# Patient Record
Sex: Male | Born: 1940 | Race: Black or African American | Hispanic: No | Marital: Married | Smoking: Former smoker
Health system: Southern US, Community
[De-identification: ages and names within clinical notes are randomized; demographics above are authoritative.]

## PROBLEM LIST (undated history)

## (undated) DIAGNOSIS — R972 Elevated prostate specific antigen [PSA]: Secondary | ICD-10-CM

## (undated) DIAGNOSIS — E7800 Pure hypercholesterolemia, unspecified: Secondary | ICD-10-CM

## (undated) DIAGNOSIS — I739 Peripheral vascular disease, unspecified: Secondary | ICD-10-CM

## (undated) DIAGNOSIS — D649 Anemia, unspecified: Secondary | ICD-10-CM

## (undated) DIAGNOSIS — K219 Gastro-esophageal reflux disease without esophagitis: Secondary | ICD-10-CM

## (undated) DIAGNOSIS — Z9889 Other specified postprocedural states: Secondary | ICD-10-CM

## (undated) DIAGNOSIS — R339 Retention of urine, unspecified: Secondary | ICD-10-CM

## (undated) DIAGNOSIS — N529 Male erectile dysfunction, unspecified: Secondary | ICD-10-CM

## (undated) DIAGNOSIS — J449 Chronic obstructive pulmonary disease, unspecified: Secondary | ICD-10-CM

## (undated) DIAGNOSIS — R351 Nocturia: Secondary | ICD-10-CM

## (undated) DIAGNOSIS — N400 Enlarged prostate without lower urinary tract symptoms: Secondary | ICD-10-CM

## (undated) DIAGNOSIS — N62 Hypertrophy of breast: Secondary | ICD-10-CM

## (undated) DIAGNOSIS — C801 Malignant (primary) neoplasm, unspecified: Secondary | ICD-10-CM

## (undated) DIAGNOSIS — N138 Other obstructive and reflux uropathy: Secondary | ICD-10-CM

## (undated) DIAGNOSIS — N320 Bladder-neck obstruction: Secondary | ICD-10-CM

## (undated) DIAGNOSIS — I10 Essential (primary) hypertension: Secondary | ICD-10-CM

## (undated) DIAGNOSIS — N403 Nodular prostate with lower urinary tract symptoms: Secondary | ICD-10-CM

## (undated) HISTORY — DX: Other obstructive and reflux uropathy: N13.8

## (undated) HISTORY — DX: Benign prostatic hyperplasia without lower urinary tract symptoms: N40.0

## (undated) HISTORY — DX: Chronic obstructive pulmonary disease, unspecified: J44.9

## (undated) HISTORY — DX: Hypertrophy of breast: N62

## (undated) HISTORY — DX: Male erectile dysfunction, unspecified: N52.9

## (undated) HISTORY — DX: Nocturia: R35.1

## (undated) HISTORY — DX: Anemia, unspecified: D64.9

## (undated) HISTORY — DX: Gastro-esophageal reflux disease without esophagitis: K21.9

## (undated) HISTORY — DX: Retention of urine, unspecified: R33.9

## (undated) HISTORY — DX: Other obstructive and reflux uropathy: N40.3

## (undated) HISTORY — DX: Other specified postprocedural states: Z98.890

## (undated) HISTORY — DX: Bladder-neck obstruction: N32.0

## (undated) HISTORY — DX: Peripheral vascular disease, unspecified: I73.9

## (undated) HISTORY — PX: ANGIOPLASTY: SHX39

## (undated) HISTORY — DX: Essential (primary) hypertension: I10

## (undated) HISTORY — DX: Elevated prostate specific antigen (PSA): R97.20

## (undated) HISTORY — DX: Pure hypercholesterolemia, unspecified: E78.00

---

## 1998-09-30 DIAGNOSIS — Z9889 Other specified postprocedural states: Secondary | ICD-10-CM

## 1998-09-30 HISTORY — DX: Other specified postprocedural states: Z98.890

## 2003-08-12 ENCOUNTER — Other Ambulatory Visit: Payer: Self-pay

## 2004-04-01 ENCOUNTER — Ambulatory Visit: Payer: Self-pay | Admitting: Internal Medicine

## 2004-04-20 ENCOUNTER — Ambulatory Visit: Payer: Self-pay | Admitting: *Deleted

## 2004-05-18 ENCOUNTER — Ambulatory Visit: Payer: Self-pay | Admitting: *Deleted

## 2004-05-22 ENCOUNTER — Inpatient Hospital Stay: Payer: Self-pay | Admitting: Internal Medicine

## 2004-05-30 ENCOUNTER — Observation Stay: Payer: Self-pay | Admitting: Internal Medicine

## 2004-06-01 ENCOUNTER — Ambulatory Visit: Payer: Self-pay | Admitting: Unknown Physician Specialty

## 2004-06-02 ENCOUNTER — Ambulatory Visit: Payer: Self-pay | Admitting: Internal Medicine

## 2004-06-17 ENCOUNTER — Other Ambulatory Visit: Payer: Self-pay

## 2004-06-17 ENCOUNTER — Inpatient Hospital Stay: Payer: Self-pay | Admitting: Cardiology

## 2004-06-18 ENCOUNTER — Other Ambulatory Visit: Payer: Self-pay

## 2004-08-14 ENCOUNTER — Ambulatory Visit: Payer: Self-pay | Admitting: Internal Medicine

## 2004-09-08 ENCOUNTER — Ambulatory Visit: Payer: Self-pay | Admitting: Internal Medicine

## 2004-10-09 ENCOUNTER — Ambulatory Visit: Payer: Self-pay | Admitting: Internal Medicine

## 2004-11-01 ENCOUNTER — Ambulatory Visit: Payer: Self-pay | Admitting: Internal Medicine

## 2004-11-18 ENCOUNTER — Ambulatory Visit: Payer: Self-pay | Admitting: Unknown Physician Specialty

## 2005-06-08 ENCOUNTER — Ambulatory Visit: Payer: Self-pay | Admitting: Internal Medicine

## 2005-11-08 HISTORY — PX: OTHER SURGICAL HISTORY: SHX169

## 2006-11-16 ENCOUNTER — Ambulatory Visit: Payer: Self-pay | Admitting: Unknown Physician Specialty

## 2007-05-30 ENCOUNTER — Ambulatory Visit: Payer: Self-pay | Admitting: Unknown Physician Specialty

## 2009-03-08 ENCOUNTER — Emergency Department: Payer: Self-pay | Admitting: Unknown Physician Specialty

## 2009-03-09 ENCOUNTER — Emergency Department: Payer: Self-pay | Admitting: Internal Medicine

## 2010-03-17 ENCOUNTER — Ambulatory Visit: Payer: Self-pay | Admitting: Cardiology

## 2010-04-29 ENCOUNTER — Ambulatory Visit: Payer: Self-pay | Admitting: Specialist

## 2010-06-08 ENCOUNTER — Ambulatory Visit: Payer: Self-pay | Admitting: Unknown Physician Specialty

## 2010-06-08 HISTORY — PX: COLONOSCOPY: SHX174

## 2010-06-08 HISTORY — PX: UPPER GI ENDOSCOPY: SHX6162

## 2010-06-09 LAB — PATHOLOGY REPORT

## 2011-11-16 ENCOUNTER — Ambulatory Visit (INDEPENDENT_AMBULATORY_CARE_PROVIDER_SITE_OTHER): Payer: Medicare Other | Admitting: Internal Medicine

## 2011-11-16 ENCOUNTER — Encounter: Payer: Self-pay | Admitting: Internal Medicine

## 2011-11-16 VITALS — BP 132/82 | HR 90 | Temp 97.8°F | Ht 71.0 in | Wt 173.0 lb

## 2011-11-16 DIAGNOSIS — Z139 Encounter for screening, unspecified: Secondary | ICD-10-CM

## 2011-11-16 DIAGNOSIS — I10 Essential (primary) hypertension: Secondary | ICD-10-CM

## 2011-11-16 DIAGNOSIS — E7800 Pure hypercholesterolemia, unspecified: Secondary | ICD-10-CM

## 2011-11-16 DIAGNOSIS — D649 Anemia, unspecified: Secondary | ICD-10-CM

## 2011-11-16 DIAGNOSIS — I739 Peripheral vascular disease, unspecified: Secondary | ICD-10-CM

## 2011-11-16 MED ORDER — ESOMEPRAZOLE MAGNESIUM 40 MG PO CPDR: 40.0000 mg | DELAYED_RELEASE_CAPSULE | Freq: Two times a day (BID) | ORAL | Status: DC

## 2011-11-16 NOTE — Patient Instructions (Signed)
It was good seeing you today.  I am glad you have been doing well.  I am going to schedule labs to be drawn this week.  We will notify you of the results once they become available.

## 2011-11-17 ENCOUNTER — Encounter: Payer: Self-pay | Admitting: Internal Medicine

## 2011-11-17 DIAGNOSIS — I739 Peripheral vascular disease, unspecified: Secondary | ICD-10-CM | POA: Insufficient documentation

## 2011-11-17 DIAGNOSIS — E7800 Pure hypercholesterolemia, unspecified: Secondary | ICD-10-CM | POA: Insufficient documentation

## 2011-11-17 DIAGNOSIS — I10 Essential (primary) hypertension: Secondary | ICD-10-CM | POA: Insufficient documentation

## 2011-11-17 NOTE — Assessment & Plan Note (Signed)
Is intolerant to statin medication.  He is currently taking WelChol. We'll get him scheduled for a fasting lipid panel. Continue low cholesterol diet and exercise.

## 2011-11-17 NOTE — Assessment & Plan Note (Signed)
He is currently without any leg pain with ambulation and exercise (s/p intervention). Exercises regularly. Continue risk factor modification. Followup.

## 2011-11-17 NOTE — Progress Notes (Signed)
  Subjective:    Patient ID: Adrian Cohen, male    DOB: [birth year], 71 y.o.   MRN: [id number]  HPI 71 year old male with past history of hypertension and hypercholesterolemia who comes in today for a scheduled follow up.  He states he's been doing well. He is staying active. Exercises regularly. States he goes to the [employer] at least 4-5 days a week. Walks at least 2-3 miles each time he exercises. He saw Dr. Meredeth Ide 2-3 weeks ago. Had his flu shot last week. Sees Dr. Achilles Dunk for his prostate screening and was evaluated recently - PSA was within normal limits. He is on a yearly schedule. He is scheduled to see Dr. Darrold Junker next month. Denies any chest pain tightness or shortness of breath with increased activity or exertion. States he feels that he is doing well. Nexium is controlling his acid reflux symptoms.  Past Medical History  Diagnosis Date  . Hypertension   . Hypercholesterolemia   . Peripheral vascular disease     Review of Systems Patient denies any headache, lightheadedness or dizziness.  No significant allergies or sinus symptoms. No chest pain, tightness or palpatations.  No increased shortness of breath, cough or congestion. No acid reflux, dysphagia or odynophagia. No nausea or vomiting.  No abdominal pain or cramping.  No bowel change, such as diarrhea, constipation, BRBPR or melana.  No urine change.        Objective:   Physical Exam Filed Vitals:   11/16/11 1438  BP: 132/82  Pulse: 90  Temp: 97.8 F (36.6 C)   71year old male in no acute distress.   HEENT:  Nares - clear.  OP- without lesions or erythema.  NECK:  Supple, nontender.  No audible carotid bruit.   HEART:  Appears to be regular. LUNGS:  Without crackles or wheezing audible.  Respirations even and unlabored.   RADIAL PULSE:  Equal bilaterally.  ABDOMEN:  Soft, nontender.  No audible abdominal bruit.   EXTREMITIES:  No increased edema to be present.                    Assessment & Plan:  Pulmonary. His  breathing is stable. He has no shortness of breath with increased activity or exertion.  Just saw Dr. Meredeth Ide 2-3 weeks ago.    Health maintenance. Schedule him for a physical at his next visit.  He is up to date with his colonoscopy. Sees gastroenterology.  He is up to date with prostate screening.  See above. Will schedule for fasting labs to include a cholesterol check.  Had his flu shot last week.

## 2011-11-17 NOTE — Assessment & Plan Note (Signed)
Blood pressure is under good control. Continue current medication regimen. Check metabolic panel with next labs.

## 2011-11-19 ENCOUNTER — Other Ambulatory Visit (INDEPENDENT_AMBULATORY_CARE_PROVIDER_SITE_OTHER): Payer: Medicare Other

## 2011-11-19 DIAGNOSIS — I10 Essential (primary) hypertension: Secondary | ICD-10-CM

## 2011-11-19 DIAGNOSIS — Z139 Encounter for screening, unspecified: Secondary | ICD-10-CM

## 2011-11-19 DIAGNOSIS — E7800 Pure hypercholesterolemia, unspecified: Secondary | ICD-10-CM

## 2011-11-19 DIAGNOSIS — D649 Anemia, unspecified: Secondary | ICD-10-CM

## 2011-11-19 LAB — CBC WITH DIFFERENTIAL/PLATELET
Basophils Relative: 0.8 % (ref 0.0–3.0)
Eosinophils Relative: 3.7 % (ref 0.0–5.0)
HCT: 42.5 % (ref 39.0–52.0)
Hemoglobin: 14 g/dL (ref 13.0–17.0)
Lymphocytes Relative: 42.5 % (ref 12.0–46.0)
Lymphs Abs: 1 10*3/uL (ref 0.7–4.0)
Monocytes Relative: 18.9 % — ABNORMAL HIGH (ref 3.0–12.0)
Neutro Abs: 0.8 10*3/uL — ABNORMAL LOW (ref 1.4–7.7)
RBC: 4.76 Mil/uL (ref 4.22–5.81)
RDW: 12.6 % (ref 11.5–14.6)
WBC: 2.4 10*3/uL — ABNORMAL LOW (ref 4.5–10.5)

## 2011-11-19 LAB — HEPATIC FUNCTION PANEL
AST: 25 U/L (ref 0–37)
Alkaline Phosphatase: 45 U/L (ref 39–117)
Total Bilirubin: 0.7 mg/dL (ref 0.3–1.2)

## 2011-11-19 LAB — LIPID PANEL
Total CHOL/HDL Ratio: 3
VLDL: 14.4 mg/dL (ref 0.0–40.0)

## 2011-11-20 LAB — BASIC METABOLIC PANEL WITH GFR
BUN: 5 mg/dL — ABNORMAL LOW (ref 6–23)
CO2: 32 mEq/L (ref 19–32)
Calcium: 9.5 mg/dL (ref 8.4–10.5)
Chloride: 95 mEq/L — ABNORMAL LOW (ref 96–112)
Creat: 0.81 mg/dL (ref 0.50–1.35)
Glucose, Bld: 77 mg/dL (ref 70–99)

## 2011-11-22 LAB — VARICELLA ZOSTER ANTIBODY, IGG: Varicella IgG: 6.19 {ISR} — ABNORMAL HIGH (ref <0.90)

## 2011-11-24 ENCOUNTER — Other Ambulatory Visit: Payer: Self-pay | Admitting: Internal Medicine

## 2011-11-24 ENCOUNTER — Encounter: Payer: Self-pay | Admitting: *Deleted

## 2011-11-24 DIAGNOSIS — D709 Neutropenia, unspecified: Secondary | ICD-10-CM

## 2011-11-24 DIAGNOSIS — E878 Other disorders of electrolyte and fluid balance, not elsewhere classified: Secondary | ICD-10-CM

## 2011-11-24 DIAGNOSIS — D702 Other drug-induced agranulocytosis: Secondary | ICD-10-CM

## 2011-12-01 ENCOUNTER — Telehealth: Payer: Self-pay | Admitting: Internal Medicine

## 2011-12-01 NOTE — Telephone Encounter (Signed)
Pt received lab results didn't understand please call to explain  Pt made appointment for repeat labs 11/13

## 2011-12-08 NOTE — Telephone Encounter (Signed)
Dr. Lorin Picket this patient wants to know the out come of lab where where he was tested for chicken pox virus (he had when younger)

## 2011-12-15 ENCOUNTER — Other Ambulatory Visit (INDEPENDENT_AMBULATORY_CARE_PROVIDER_SITE_OTHER): Payer: Medicare Other

## 2011-12-15 DIAGNOSIS — D709 Neutropenia, unspecified: Secondary | ICD-10-CM

## 2011-12-15 DIAGNOSIS — E878 Other disorders of electrolyte and fluid balance, not elsewhere classified: Secondary | ICD-10-CM

## 2011-12-15 LAB — CBC WITH DIFFERENTIAL/PLATELET
Basophils Absolute: 0 10*3/uL (ref 0.0–0.1)
Eosinophils Relative: 2.5 % (ref 0.0–5.0)
HCT: 41.2 % (ref 39.0–52.0)
Lymphocytes Relative: 39.8 % (ref 12.0–46.0)
Lymphs Abs: 1.1 10*3/uL (ref 0.7–4.0)
Monocytes Relative: 19.2 % — ABNORMAL HIGH (ref 3.0–12.0)
Neutrophils Relative %: 37.7 % — ABNORMAL LOW (ref 43.0–77.0)
Platelets: 256 10*3/uL (ref 150.0–400.0)
WBC: 2.7 10*3/uL — ABNORMAL LOW (ref 4.5–10.5)

## 2011-12-15 LAB — SODIUM: Sodium: 133 mEq/L — ABNORMAL LOW (ref 135–145)

## 2011-12-17 ENCOUNTER — Telehealth: Payer: Self-pay | Admitting: Internal Medicine

## 2011-12-17 DIAGNOSIS — D709 Neutropenia, unspecified: Secondary | ICD-10-CM

## 2011-12-17 NOTE — Telephone Encounter (Signed)
Pt notified of lab results and my desire to have him follow up with hematology to confirm no further w/up warranted.  He agrees to appt.  Will hold on shingles vaccine (given leukopenia and neutrapenia) - for now.  Will order for referral.  Will need follow up of his sodium.

## 2012-01-21 ENCOUNTER — Ambulatory Visit: Payer: Self-pay | Admitting: Internal Medicine

## 2012-02-23 ENCOUNTER — Ambulatory Visit: Payer: Self-pay | Admitting: Hematology and Oncology

## 2012-02-24 ENCOUNTER — Ambulatory Visit: Payer: Self-pay | Admitting: Hematology and Oncology

## 2012-02-24 LAB — CBC CANCER CENTER
Basophil #: 0 x10 3/mm (ref 0.0–0.1)
Eosinophil #: 0.1 x10 3/mm (ref 0.0–0.7)
Eosinophil %: 2.8 %
Lymphocyte #: 1.1 x10 3/mm (ref 1.0–3.6)
Lymphocyte %: 34.7 %
MCH: 29.8 pg (ref 26.0–34.0)
MCHC: 34 g/dL (ref 32.0–36.0)
Monocyte #: 0.6 x10 3/mm (ref 0.2–1.0)
Monocyte %: 18.4 %
Neutrophil #: 1.3 x10 3/mm — ABNORMAL LOW (ref 1.4–6.5)
Neutrophil %: 44 %
Platelet: 241 x10 3/mm (ref 150–440)
RBC: 4.87 10*6/uL (ref 4.40–5.90)
RDW: 12.6 % (ref 11.5–14.5)

## 2012-03-04 ENCOUNTER — Ambulatory Visit: Payer: Self-pay | Admitting: Hematology and Oncology

## 2012-03-14 ENCOUNTER — Encounter: Payer: Self-pay | Admitting: Internal Medicine

## 2012-03-15 ENCOUNTER — Encounter: Payer: Self-pay | Admitting: Internal Medicine

## 2012-03-20 ENCOUNTER — Ambulatory Visit (INDEPENDENT_AMBULATORY_CARE_PROVIDER_SITE_OTHER): Payer: Medicare Other | Admitting: Internal Medicine

## 2012-03-20 ENCOUNTER — Encounter: Payer: Self-pay | Admitting: Internal Medicine

## 2012-03-20 VITALS — BP 112/72 | HR 93 | Temp 97.6°F | Ht 71.0 in | Wt 177.0 lb

## 2012-03-20 DIAGNOSIS — D72819 Decreased white blood cell count, unspecified: Secondary | ICD-10-CM

## 2012-03-20 DIAGNOSIS — I10 Essential (primary) hypertension: Secondary | ICD-10-CM

## 2012-03-20 DIAGNOSIS — Z1211 Encounter for screening for malignant neoplasm of colon: Secondary | ICD-10-CM

## 2012-03-20 DIAGNOSIS — E7800 Pure hypercholesterolemia, unspecified: Secondary | ICD-10-CM

## 2012-03-20 DIAGNOSIS — I739 Peripheral vascular disease, unspecified: Secondary | ICD-10-CM

## 2012-03-21 ENCOUNTER — Encounter: Payer: Self-pay | Admitting: Internal Medicine

## 2012-03-21 DIAGNOSIS — D72819 Decreased white blood cell count, unspecified: Secondary | ICD-10-CM | POA: Insufficient documentation

## 2012-03-21 NOTE — Assessment & Plan Note (Signed)
On welchol.  Low cholesterol diet and exercise.  Follow lipid panel.   

## 2012-03-21 NOTE — Assessment & Plan Note (Signed)
Blood pressure under good control.  Same medication regimen.  Follow metabolic panel.   

## 2012-03-21 NOTE — Assessment & Plan Note (Signed)
Recently evaluated by hematology.  States everything checked out fine.  Obtain records.  Due follow up 03/30/12.

## 2012-03-21 NOTE — Progress Notes (Signed)
Subjective:    Patient ID: Adrian Cohen, male    DOB: 23-Aug-1940, 72 y.o.   MRN: [id number]  HPI 72 year old male with past history of hypertension and hypercholesterolemia who comes in today to follow up on these issues as well as for his physical exam.  He states he's been doing well. He is staying active. Exercises regularly. States he [place] [facility] [employer] at least 4-5 days a week.  Breathing stable.  Sees Dr Meredeth Ide.  Had his flu shot last week. Sees Dr. Achilles Dunk for his prostate screening and was evaluated recently - PSA was within normal limits. He is on a yearly schedule.  Sees Dr Darrold Junker.  Just evaluated.  Doing well.  Denies any chest pain tightness or shortness of breath with increased activity or exertion. States he feels that he is doing well. Nexium is controlling his acid reflux symptoms.  Past Medical History  Diagnosis Date  . Hypertension   . Hypercholesterolemia   . Peripheral vascular disease   . COPD (chronic obstructive pulmonary disease)   . Asthma   . Anemia   . S/P colonoscopy 08.29.00    Current Outpatient Prescriptions on File Prior to Visit  Medication Sig Dispense Refill  . amLODipine (NORVASC) 10 MG tablet Take 10 mg by mouth daily.       Marland Kitchen aspirin 81 MG tablet Take 81 mg by mouth daily.      . colesevelam (WELCHOL) 625 MG tablet Take 1,875 mg by mouth 2 (two) times daily with a meal. Take 3 tablets twice daily      . COMBIVENT RESPIMAT 20-100 MCG/ACT AERS respimat Take 1 puff by mouth every 6 (six) hours as needed.       Marland Kitchen esomeprazole (NEXIUM) 40 MG capsule Take 1 capsule (40 mg total) by mouth 2 (two) times daily.  180 capsule  3  . fluticasone-salmeterol (ADVAIR HFA) 45-21 MCG/ACT inhaler Inhale 2 puffs into the lungs 2 (two) times daily. Inhale 2 puffs as directed every twelve hours      . hydrochlorothiazide (HYDRODIURIL) 25 MG tablet Take 25 mg by mouth daily.       Marland Kitchen KLOR-CON M10 10 MEQ tablet Take 10 mEq by mouth daily.       . Multiple Vitamin  (MULTIVITAMIN) capsule Take 1 capsule by mouth daily. Take 1 capsule by mouth once a day      . NASONEX 50 MCG/ACT nasal spray Place 2 sprays into the nose daily.       Marland Kitchen olopatadine (PATANOL) 0.1 % ophthalmic solution 1 drop 2 (two) times daily.      . quinapril (ACCUPRIL) 40 MG tablet Take 40 mg by mouth daily.       . saw palmetto 500 MG capsule Take 450 mg by mouth daily. Take 1 capsule by mouth once a day      . vitamin E 400 UNIT capsule Take 400 Units by mouth daily. Take 1 by mouth once a day      . WELCHOL 625 MG tablet Take 3,750 mg by mouth daily.        No current facility-administered medications on file prior to visit.    Review of Systems Patient denies any headache, lightheadedness or dizziness.  No significant allergies or sinus symptoms. No chest pain, tightness or palpitations.  No increased shortness of breath, cough or congestion. No acid reflux, dysphagia or odynophagia. No nausea or vomiting.  No abdominal pain or cramping.  No bowel change, such as diarrhea, constipation,  BRBPR or melana.  No urine change.  Saw hematology for his blood counts.  States everything checked out fine.  Due to follow up 03/30/12.        Objective:   Physical Exam  Filed Vitals:   03/20/12 1326  BP: 112/72  Pulse: 93  Temp: 97.6 F (36.4 C)   Blood pressure recheck:  124/70, pulse 6  72 year old male in no acute distress.  HEENT:  Nares - clear.  Oropharynx - without lesions. NECK:  Supple.  Nontender.  No audible carotid bruit.  HEART:  Appears to be regular.   LUNGS:  No crackles or wheezing audible.  Respirations even and unlabored.   RADIAL PULSE:  Equal bilaterally.  ABDOMEN:  Soft.  Nontender.  Bowel sounds present and normal.  No audible abdominal bruit.  GU:  Performed by Dr Achilles Dunk. EXTREMITIES:  No increased edema present.  DP pulses palpable and equal bilaterally.   SKIN:  No lesions.         Assessment & Plan:  PULMONARY.  His breathing is stable. He has no shortness  of breath with increased activity or exertion.  Sees Dr Meredeth Ide.    HEALTH MAINTENANCE.  Physical today.   He is up to date with his colonoscopy.  Sees gastroenterology.  He is up to date with prostate screening.  See above.

## 2012-03-21 NOTE — Assessment & Plan Note (Signed)
Exercising.  No pain.  Doing well.  Continue daily aspirin and risk factor modification.   

## 2012-03-24 ENCOUNTER — Other Ambulatory Visit: Payer: Self-pay | Admitting: *Deleted

## 2012-03-27 MED ORDER — POTASSIUM CHLORIDE CRYS ER 10 MEQ PO TBCR: 10.0000 meq | EXTENDED_RELEASE_TABLET | Freq: Every day | ORAL | Status: DC

## 2012-03-27 MED ORDER — QUINAPRIL HCL 40 MG PO TABS: 40.0000 mg | ORAL_TABLET | Freq: Every day | ORAL | Status: DC

## 2012-03-27 MED ORDER — AMLODIPINE BESYLATE 10 MG PO TABS: 10.0000 mg | ORAL_TABLET | Freq: Every day | ORAL | Status: DC

## 2012-03-27 NOTE — Telephone Encounter (Signed)
Eprescribed.

## 2012-03-28 ENCOUNTER — Other Ambulatory Visit: Payer: Self-pay | Admitting: *Deleted

## 2012-03-28 NOTE — Telephone Encounter (Signed)
Refill requests were received and are being filled

## 2012-03-29 ENCOUNTER — Telehealth: Payer: Self-pay | Admitting: *Deleted

## 2012-03-29 ENCOUNTER — Other Ambulatory Visit: Payer: Medicare Other

## 2012-03-29 DIAGNOSIS — Z1211 Encounter for screening for malignant neoplasm of colon: Secondary | ICD-10-CM

## 2012-03-29 NOTE — Telephone Encounter (Signed)
Lab called and patient needed another IFOB mailed to him. Mailed patient a new one with instruction.

## 2012-03-30 LAB — CBC CANCER CENTER
Eosinophil #: 0.1 x10 3/mm (ref 0.0–0.7)
HCT: 41.6 % (ref 40.0–52.0)
Lymphocyte #: 1 x10 3/mm (ref 1.0–3.6)
MCH: 29.9 pg (ref 26.0–34.0)
MCHC: 34.3 g/dL (ref 32.0–36.0)
Monocyte %: 16.8 %
RDW: 12.7 % (ref 11.5–14.5)
WBC: 3.1 x10 3/mm — ABNORMAL LOW (ref 3.8–10.6)

## 2012-03-30 LAB — BASIC METABOLIC PANEL
Anion Gap: 7 (ref 7–16)
BUN: 8 mg/dL (ref 7–18)
Calcium, Total: 8.5 mg/dL (ref 8.5–10.1)
Chloride: 99 mmol/L (ref 98–107)
EGFR (Non-African Amer.): >60
Glucose: 76 mg/dL (ref 65–99)
Sodium: 137 mmol/L (ref 136–145)

## 2012-03-31 ENCOUNTER — Telehealth: Payer: Self-pay | Admitting: *Deleted

## 2012-03-31 NOTE — Telephone Encounter (Signed)
Mailed Ifob to patient with instructions on how to use.

## 2012-04-01 ENCOUNTER — Ambulatory Visit: Payer: Self-pay | Admitting: Hematology and Oncology

## 2012-04-11 ENCOUNTER — Other Ambulatory Visit (INDEPENDENT_AMBULATORY_CARE_PROVIDER_SITE_OTHER): Payer: Medicare Other

## 2012-04-11 ENCOUNTER — Other Ambulatory Visit: Payer: Medicare Other

## 2012-04-11 DIAGNOSIS — I10 Essential (primary) hypertension: Secondary | ICD-10-CM

## 2012-04-11 DIAGNOSIS — E7800 Pure hypercholesterolemia, unspecified: Secondary | ICD-10-CM

## 2012-04-11 LAB — LDL CHOLESTEROL, DIRECT: Direct LDL: 137.5 mg/dL

## 2012-04-11 LAB — HEPATIC FUNCTION PANEL
ALT: 12 U/L (ref 0–53)
AST: 23 U/L (ref 0–37)
Albumin: 4.1 g/dL (ref 3.5–5.2)
Total Bilirubin: 0.8 mg/dL (ref 0.3–1.2)
Total Protein: 7 g/dL (ref 6.0–8.3)

## 2012-04-11 LAB — BASIC METABOLIC PANEL
Calcium: 9.4 mg/dL (ref 8.4–10.5)
Creatinine, Ser: 1 mg/dL (ref 0.4–1.5)
GFR: 98.12 mL/min (ref >60.00)
Sodium: 134 mEq/L — ABNORMAL LOW (ref 135–145)

## 2012-04-11 LAB — LIPID PANEL
Cholesterol: 217 mg/dL — ABNORMAL HIGH (ref 0–200)
HDL: 57 mg/dL (ref >39.00)
Triglycerides: 84 mg/dL (ref 0.0–149.0)

## 2012-04-19 ENCOUNTER — Ambulatory Visit (INDEPENDENT_AMBULATORY_CARE_PROVIDER_SITE_OTHER): Payer: Medicare Other | Admitting: Internal Medicine

## 2012-04-19 ENCOUNTER — Other Ambulatory Visit: Payer: Self-pay | Admitting: Internal Medicine

## 2012-04-19 DIAGNOSIS — D649 Anemia, unspecified: Secondary | ICD-10-CM

## 2012-04-19 DIAGNOSIS — Z1211 Encounter for screening for malignant neoplasm of colon: Secondary | ICD-10-CM

## 2012-04-19 DIAGNOSIS — R195 Other fecal abnormalities: Secondary | ICD-10-CM

## 2012-04-19 LAB — FECAL OCCULT BLOOD, IMMUNOCHEMICAL: Fecal Occult Bld: POSITIVE

## 2012-05-01 NOTE — Progress Notes (Signed)
Order placed for GI referral for heme positive stool and anemia

## 2012-05-15 ENCOUNTER — Telehealth: Payer: Self-pay | Admitting: Internal Medicine

## 2012-05-15 DIAGNOSIS — R195 Other fecal abnormalities: Secondary | ICD-10-CM

## 2012-05-15 MED ORDER — HYDROCHLOROTHIAZIDE 25 MG PO TABS: 25.0000 mg | ORAL_TABLET | Freq: Every day | ORAL | Status: DC

## 2012-05-15 NOTE — Telephone Encounter (Signed)
Please Advise

## 2012-05-15 NOTE — Telephone Encounter (Signed)
Spoke with pt-Rx sent to [facility], Pt needs a GI referral. Advised pt that Amber will be in touch to schedule referral soon

## 2012-05-15 NOTE — Telephone Encounter (Signed)
Hydrochlorothiazide 25 mg refill needed.    Stated he received a call that he needed to go to [facility].  Pt states he has not heard from [facility] about an appointment.

## 2012-05-16 NOTE — Telephone Encounter (Signed)
Pt called regarding his referral to GI.  Let me know if I need to do anything more.  Thanks.

## 2012-05-17 NOTE — Telephone Encounter (Signed)
Order placed for referral to GI.   Thanks

## 2012-05-17 NOTE — Telephone Encounter (Signed)
I do not have a referral for GI for this gentleman. Could you please place one?

## 2012-06-07 ENCOUNTER — Other Ambulatory Visit: Payer: Self-pay | Admitting: Internal Medicine

## 2012-06-13 ENCOUNTER — Encounter: Payer: Self-pay | Admitting: Internal Medicine

## 2012-07-20 ENCOUNTER — Ambulatory Visit: Payer: Medicare Other | Admitting: Internal Medicine

## 2012-07-29 ENCOUNTER — Encounter: Payer: Self-pay | Admitting: Internal Medicine

## 2012-07-29 DIAGNOSIS — Z86010 Personal history of colonic polyps: Secondary | ICD-10-CM

## 2012-07-29 DIAGNOSIS — K2970 Gastritis, unspecified, without bleeding: Secondary | ICD-10-CM | POA: Insufficient documentation

## 2012-08-15 ENCOUNTER — Encounter: Payer: Self-pay | Admitting: Internal Medicine

## 2012-08-22 ENCOUNTER — Ambulatory Visit (INDEPENDENT_AMBULATORY_CARE_PROVIDER_SITE_OTHER): Payer: Self-pay | Admitting: Internal Medicine

## 2012-08-22 ENCOUNTER — Encounter: Payer: Self-pay | Admitting: Internal Medicine

## 2012-08-22 VITALS — BP 118/70 | HR 88 | Temp 98.2°F | Ht 71.0 in | Wt 170.5 lb

## 2012-08-22 DIAGNOSIS — D649 Anemia, unspecified: Secondary | ICD-10-CM

## 2012-08-22 DIAGNOSIS — D72819 Decreased white blood cell count, unspecified: Secondary | ICD-10-CM

## 2012-08-22 DIAGNOSIS — I739 Peripheral vascular disease, unspecified: Secondary | ICD-10-CM

## 2012-08-22 DIAGNOSIS — K130 Diseases of lips: Secondary | ICD-10-CM

## 2012-08-22 DIAGNOSIS — E7800 Pure hypercholesterolemia, unspecified: Secondary | ICD-10-CM

## 2012-08-22 DIAGNOSIS — Z86010 Personal history of colonic polyps: Secondary | ICD-10-CM

## 2012-08-22 DIAGNOSIS — K2970 Gastritis, unspecified, without bleeding: Secondary | ICD-10-CM

## 2012-08-22 DIAGNOSIS — N390 Urinary tract infection, site not specified: Secondary | ICD-10-CM

## 2012-08-22 DIAGNOSIS — I10 Essential (primary) hypertension: Secondary | ICD-10-CM

## 2012-08-22 NOTE — Progress Notes (Signed)
Subjective:    Patient ID: Adrian Cohen, male    DOB: Apr 19, 1940, 72 y.o.   MRN: [id number]  HPI 72 year old male with past history of hypertension and hypercholesterolemia who comes in today for a scheduled follow up.  He states he's been doing well. He is staying active. Exercises regularly. States he goes to [facility] [employer] at least 4-5 days a week.  Breathing stable.  Sees Dr Meredeth Ide.  Sees Dr Darrold Junker.  Doing well from a cardiac standpoint.   Denies any chest pain tightness or shortness of breath with increased activity or exertion.   Nexium is controlling his acid reflux symptoms.  He has recently noticed some increased dysuria and urgency with associated decreased urination.  [facility]  Placed on Doxycycline.  Was told had a bladder infection (E. Coli - per pt).  Has noticed improvement in his symptoms after starting Doxycycline.  Is still noticing some decreased urination in the am.  States this has been present for a while, not just with the infection.  He also has a persistent lip lesion and request referral to dermatology.     Past Medical History  Diagnosis Date  . Hypertension   . Hypercholesterolemia   . Peripheral vascular disease   . COPD (chronic obstructive pulmonary disease)   . Asthma   . Anemia   . S/P colonoscopy 08.29.00    Current Outpatient Prescriptions on File Prior to Visit  Medication Sig Dispense Refill  . amLODipine (NORVASC) 10 MG tablet TAKE 1 TABLET DAILY  90 tablet  0  . aspirin 81 MG tablet Take 81 mg by mouth daily.      . colesevelam (WELCHOL) 625 MG tablet Take 1,875 mg by mouth 2 (two) times daily with a meal. Take 3 tablets twice daily      . COMBIVENT RESPIMAT 20-100 MCG/ACT AERS respimat Take 1 puff by mouth every 6 (six) hours as needed.       Marland Kitchen esomeprazole (NEXIUM) 40 MG capsule Take 1 capsule (40 mg total) by mouth 2 (two) times daily.  180 capsule  3  . fluticasone-salmeterol (ADVAIR HFA) 45-21 MCG/ACT inhaler Inhale 2 puffs  into the lungs 2 (two) times daily. Inhale 2 puffs as directed every twelve hours      . hydrochlorothiazide (HYDRODIURIL) 25 MG tablet Take 1 tablet (25 mg total) by mouth daily.  90 tablet  1  . Multiple Vitamin (MULTIVITAMIN) capsule Take 1 capsule by mouth daily. Take 1 capsule by mouth once a day      . NASONEX 50 MCG/ACT nasal spray Place 2 sprays into the nose daily.       . potassium chloride (KLOR-CON M10) 10 MEQ tablet Take 1 tablet (10 mEq total) by mouth daily.  90 tablet  1  . quinapril (ACCUPRIL) 40 MG tablet Take 1 tablet (40 mg total) by mouth daily.  90 tablet  1  . saw palmetto 500 MG capsule Take 450 mg by mouth daily. Take 1 capsule by mouth once a day      . vitamin E 400 UNIT capsule Take 400 Units by mouth daily. Take 1 by mouth once a day      . WELCHOL 625 MG tablet Take 3,750 mg by mouth daily.        No current facility-administered medications on file prior to visit.    Review of Systems Patient denies any headache, lightheadedness or dizziness.  No significant allergies or sinus symptoms. No chest pain, tightness  or palpitations.  No increased shortness of breath, cough or congestion. No acid reflux, dysphagia or odynophagia. No nausea or vomiting.  No abdominal pain or cramping.  No bowel change, such as diarrhea, constipation, BRBPR or melana.  Urinary symptoms as outlined.  Better with treatment of recent infection.  Still with hesitancy issues in the am.         Objective:   Physical Exam  Filed Vitals:   08/22/12 1449  BP: 118/70  Pulse: 88  Temp: 98.2 F (59.72 C)   72 year old male in no acute distress.  HEENT:  Nares - clear.  Oropharynx - without lesions.  Lower lip lesion.  NECK:  Supple.  Nontender.  No audible carotid bruit.  HEART:  Appears to be regular.   LUNGS:  No crackles or wheezing audible.  Respirations even and unlabored.   RADIAL PULSE:  Equal bilaterally.  ABDOMEN:  Soft.  Nontender.  Bowel sounds present and normal.  No audible  abdominal bruit.  EXTREMITIES:  No increased edema present.  DP pulses palpable and equal bilaterally.   SKIN:  No lesions.         Assessment & Plan:  GU.  With dysuria and hesitancy previously.  Treated with doxycycline with noted improvement.  Still with urinary hesitancy in the am.  Will refer back to Dr Achilles Dunk for evaluation and further treatment.    DERMATOLOGY.  Persistent lip lesion.  Refer to dermatology for evaluation and treatment.    PULMONARY.  His breathing is stable. He has no shortness of breath with increased activity or exertion.  Sees Dr Meredeth Ide.    HEALTH MAINTENANCE.  Physical 03/20/12.   He is up to date with his colonoscopy.  Sees gastroenterology.  He is up to date with prostate screening.

## 2012-08-23 ENCOUNTER — Encounter: Payer: Self-pay | Admitting: Internal Medicine

## 2012-08-23 NOTE — Assessment & Plan Note (Signed)
On welchol.  Low cholesterol diet and exercise.  Follow lipid panel.   

## 2012-08-23 NOTE — Assessment & Plan Note (Signed)
Colonoscopy as outlined.  Recommended follow up colonoscopy in 2017.    

## 2012-08-23 NOTE — Assessment & Plan Note (Signed)
Asymptomatic on current regimen

## 2012-08-23 NOTE — Assessment & Plan Note (Signed)
Exercising.  No pain.  Doing well.  Continue daily aspirin and risk factor modification.   

## 2012-08-23 NOTE — Assessment & Plan Note (Signed)
Blood pressure under good control.  Same medication regimen.  Follow metabolic panel.   

## 2012-08-23 NOTE — Assessment & Plan Note (Signed)
Evaluated by hematology.  Stable.   

## 2012-08-24 ENCOUNTER — Encounter: Payer: Self-pay | Admitting: Unknown Physician Specialty

## 2012-08-24 ENCOUNTER — Encounter: Payer: Self-pay | Admitting: Emergency Medicine

## 2012-08-30 ENCOUNTER — Other Ambulatory Visit (INDEPENDENT_AMBULATORY_CARE_PROVIDER_SITE_OTHER): Payer: Medicare Other

## 2012-08-30 DIAGNOSIS — D649 Anemia, unspecified: Secondary | ICD-10-CM

## 2012-08-30 DIAGNOSIS — I10 Essential (primary) hypertension: Secondary | ICD-10-CM

## 2012-08-30 DIAGNOSIS — D72819 Decreased white blood cell count, unspecified: Secondary | ICD-10-CM

## 2012-08-30 DIAGNOSIS — E7800 Pure hypercholesterolemia, unspecified: Secondary | ICD-10-CM

## 2012-08-30 LAB — LIPID PANEL
HDL: 47.5 mg/dL (ref >39.00)
Total CHOL/HDL Ratio: 4
Triglycerides: 110 mg/dL (ref 0.0–149.0)
VLDL: 22 mg/dL (ref 0.0–40.0)

## 2012-08-30 LAB — CBC WITH DIFFERENTIAL/PLATELET
Basophils Absolute: 0.1 10*3/uL (ref 0.0–0.1)
Eosinophils Relative: 2.4 % (ref 0.0–5.0)
HCT: 37.1 % — ABNORMAL LOW (ref 39.0–52.0)
Hemoglobin: 12.4 g/dL — ABNORMAL LOW (ref 13.0–17.0)
Lymphocytes Relative: 33.1 % (ref 12.0–46.0)
Lymphs Abs: 0.9 10*3/uL (ref 0.7–4.0)
Monocytes Relative: 14.7 % — ABNORMAL HIGH (ref 3.0–12.0)
Neutro Abs: 1.3 10*3/uL — ABNORMAL LOW (ref 1.4–7.7)
RDW: 13 % (ref 11.5–14.6)
WBC: 2.6 10*3/uL — ABNORMAL LOW (ref 4.5–10.5)

## 2012-08-30 LAB — BASIC METABOLIC PANEL
CO2: 29 mEq/L (ref 19–32)
Calcium: 9.6 mg/dL (ref 8.4–10.5)
Creatinine, Ser: 0.8 mg/dL (ref 0.4–1.5)
GFR: 126.05 mL/min (ref >60.00)
Glucose, Bld: 88 mg/dL (ref 70–99)

## 2012-08-30 LAB — HEPATIC FUNCTION PANEL
Albumin: 3.8 g/dL (ref 3.5–5.2)
Total Protein: 6.3 g/dL (ref 6.0–8.3)

## 2012-08-30 LAB — FERRITIN: Ferritin: 128.8 ng/mL (ref 22.0–322.0)

## 2012-09-03 ENCOUNTER — Other Ambulatory Visit: Payer: Self-pay | Admitting: Internal Medicine

## 2012-09-05 ENCOUNTER — Other Ambulatory Visit: Payer: Self-pay | Admitting: Internal Medicine

## 2012-09-05 DIAGNOSIS — D649 Anemia, unspecified: Secondary | ICD-10-CM

## 2012-09-05 NOTE — Progress Notes (Signed)
Order placed for f/u lab.   

## 2012-09-06 ENCOUNTER — Encounter: Payer: Self-pay | Admitting: Internal Medicine

## 2012-09-14 ENCOUNTER — Other Ambulatory Visit: Payer: Self-pay | Admitting: Internal Medicine

## 2012-09-21 ENCOUNTER — Other Ambulatory Visit (INDEPENDENT_AMBULATORY_CARE_PROVIDER_SITE_OTHER): Payer: Medicare Other

## 2012-09-21 ENCOUNTER — Telehealth: Payer: Self-pay | Admitting: *Deleted

## 2012-09-21 DIAGNOSIS — D649 Anemia, unspecified: Secondary | ICD-10-CM

## 2012-09-21 LAB — CBC WITH DIFFERENTIAL/PLATELET
Basophils Absolute: 0 10*3/uL (ref 0.0–0.1)
Eosinophils Absolute: 0.1 10*3/uL (ref 0.0–0.7)
Lymphocytes Relative: 40.8 % (ref 12.0–46.0)
MCHC: 33.7 g/dL (ref 30.0–36.0)
MCV: 87.8 fl (ref 78.0–100.0)
Monocytes Absolute: 0.5 10*3/uL (ref 0.1–1.0)
Neutrophils Relative %: 35.2 % — ABNORMAL LOW (ref 43.0–77.0)
RDW: 13.7 % (ref 11.5–14.6)

## 2012-09-21 LAB — IBC PANEL
Iron: 137 ug/dL (ref 42–165)
Transferrin: 241.8 mg/dL (ref 212.0–360.0)

## 2012-09-21 NOTE — Telephone Encounter (Signed)
Pt needs nexium PA

## 2012-09-22 NOTE — Telephone Encounter (Signed)
Called [facility] [place] for PA on the Nexium, received form place in Dr.Scotts folder

## 2012-09-26 ENCOUNTER — Encounter: Payer: Self-pay | Admitting: *Deleted

## 2012-10-12 ENCOUNTER — Telehealth: Payer: Self-pay | Admitting: Internal Medicine

## 2012-10-12 NOTE — Telephone Encounter (Signed)
Ok

## 2012-10-12 NOTE — Telephone Encounter (Signed)
The patient's daughter Adrian Cohen 10[birth year]17.70 is wanting you to be her primary care physician. Please advise.

## 2012-10-13 NOTE — Telephone Encounter (Signed)
Spoke with daughter appointment 11/14

## 2012-10-13 NOTE — Telephone Encounter (Signed)
Left message on [facility] phone # [phone] asking pt to call office

## 2012-10-19 ENCOUNTER — Telehealth: Payer: Self-pay | Admitting: *Deleted

## 2012-10-19 NOTE — Telephone Encounter (Signed)
Case # [id number] phone # [phone] fax #[phone]

## 2012-10-19 NOTE — Telephone Encounter (Signed)
Pt was notified that PA is in process

## 2012-10-19 NOTE — Telephone Encounter (Signed)
PA request form was re-faxed for the Esomeprazole

## 2012-10-25 ENCOUNTER — Other Ambulatory Visit: Payer: Self-pay | Admitting: Internal Medicine

## 2012-11-24 ENCOUNTER — Other Ambulatory Visit: Payer: Self-pay | Admitting: *Deleted

## 2012-11-27 MED ORDER — COLESEVELAM HCL 625 MG PO TABS: 3750.0000 mg | ORAL_TABLET | Freq: Every day | ORAL | Status: DC

## 2012-12-26 ENCOUNTER — Other Ambulatory Visit: Payer: Self-pay | Admitting: *Deleted

## 2012-12-26 ENCOUNTER — Telehealth: Payer: Self-pay | Admitting: Internal Medicine

## 2012-12-26 MED ORDER — COLESEVELAM HCL 625 MG PO TABS: 3750.0000 mg | ORAL_TABLET | Freq: Every day | ORAL | Status: DC

## 2012-12-26 NOTE — Telephone Encounter (Signed)
Resent Rx to [facility] for #540 tablets + 1 refill & pt notified

## 2012-12-26 NOTE — Telephone Encounter (Signed)
colesevelam (WELCHOL) 625 MG tablet #90 plus refills

## 2013-01-01 ENCOUNTER — Ambulatory Visit: Payer: Medicare Other | Admitting: Internal Medicine

## 2013-01-12 ENCOUNTER — Ambulatory Visit: Payer: Medicare Other | Admitting: Internal Medicine

## 2013-02-12 ENCOUNTER — Other Ambulatory Visit: Payer: Self-pay | Admitting: Internal Medicine

## 2013-02-19 ENCOUNTER — Encounter: Payer: Self-pay | Admitting: Internal Medicine

## 2013-02-19 ENCOUNTER — Ambulatory Visit (INDEPENDENT_AMBULATORY_CARE_PROVIDER_SITE_OTHER): Payer: Medicare Other | Admitting: Internal Medicine

## 2013-02-19 VITALS — BP 130/70 | HR 84 | Temp 98.3°F | Ht 71.0 in | Wt 173.8 lb

## 2013-02-19 DIAGNOSIS — M25511 Pain in right shoulder: Secondary | ICD-10-CM

## 2013-02-19 DIAGNOSIS — M25512 Pain in left shoulder: Secondary | ICD-10-CM

## 2013-02-19 DIAGNOSIS — M25519 Pain in unspecified shoulder: Secondary | ICD-10-CM

## 2013-02-19 DIAGNOSIS — Z86010 Personal history of colonic polyps: Secondary | ICD-10-CM

## 2013-02-19 DIAGNOSIS — R1032 Left lower quadrant pain: Secondary | ICD-10-CM

## 2013-02-19 DIAGNOSIS — K2970 Gastritis, unspecified, without bleeding: Secondary | ICD-10-CM

## 2013-02-19 DIAGNOSIS — I10 Essential (primary) hypertension: Secondary | ICD-10-CM

## 2013-02-19 DIAGNOSIS — I739 Peripheral vascular disease, unspecified: Secondary | ICD-10-CM

## 2013-02-19 DIAGNOSIS — R109 Unspecified abdominal pain: Secondary | ICD-10-CM

## 2013-02-19 DIAGNOSIS — E7800 Pure hypercholesterolemia, unspecified: Secondary | ICD-10-CM

## 2013-02-19 DIAGNOSIS — K2990 Gastroduodenitis, unspecified, without bleeding: Secondary | ICD-10-CM

## 2013-02-19 DIAGNOSIS — D72819 Decreased white blood cell count, unspecified: Secondary | ICD-10-CM

## 2013-02-19 NOTE — Progress Notes (Signed)
Pre-visit discussion using our clinic review tool. No additional management support is needed unless otherwise documented below in the visit note.  

## 2013-02-20 ENCOUNTER — Telehealth: Payer: Self-pay | Admitting: *Deleted

## 2013-02-20 DIAGNOSIS — E7800 Pure hypercholesterolemia, unspecified: Secondary | ICD-10-CM

## 2013-02-20 DIAGNOSIS — I10 Essential (primary) hypertension: Secondary | ICD-10-CM

## 2013-02-20 DIAGNOSIS — D72819 Decreased white blood cell count, unspecified: Secondary | ICD-10-CM

## 2013-02-20 NOTE — Telephone Encounter (Signed)
Order placed for labs.

## 2013-02-20 NOTE — Telephone Encounter (Signed)
Pt is coming in for labs tomorrow 01.21.2015 what labs and dx?

## 2013-02-21 ENCOUNTER — Other Ambulatory Visit: Payer: Self-pay | Admitting: Internal Medicine

## 2013-02-21 ENCOUNTER — Other Ambulatory Visit (INDEPENDENT_AMBULATORY_CARE_PROVIDER_SITE_OTHER): Payer: Medicare Other

## 2013-02-21 ENCOUNTER — Telehealth: Payer: Self-pay | Admitting: Internal Medicine

## 2013-02-21 ENCOUNTER — Encounter: Payer: Self-pay | Admitting: Internal Medicine

## 2013-02-21 DIAGNOSIS — E7800 Pure hypercholesterolemia, unspecified: Secondary | ICD-10-CM

## 2013-02-21 DIAGNOSIS — D72819 Decreased white blood cell count, unspecified: Secondary | ICD-10-CM

## 2013-02-21 DIAGNOSIS — I10 Essential (primary) hypertension: Secondary | ICD-10-CM

## 2013-02-21 LAB — CBC WITH DIFFERENTIAL/PLATELET
Basophils Absolute: 0 10*3/uL (ref 0.0–0.1)
Basophils Relative: 0.6 % (ref 0.0–3.0)
EOS ABS: 0.1 10*3/uL (ref 0.0–0.7)
Eosinophils Relative: 2.5 % (ref 0.0–5.0)
HCT: 38.2 % — ABNORMAL LOW (ref 39.0–52.0)
Hemoglobin: 13.1 g/dL (ref 13.0–17.0)
Lymphocytes Relative: 41.1 % (ref 12.0–46.0)
Lymphs Abs: 1.1 10*3/uL (ref 0.7–4.0)
MCHC: 34.1 g/dL (ref 30.0–36.0)
MCV: 87.2 fl (ref 78.0–100.0)
MONO ABS: 0.5 10*3/uL (ref 0.1–1.0)
Monocytes Relative: 18.4 % — ABNORMAL HIGH (ref 3.0–12.0)
NEUTROS PCT: 37.4 % — AB (ref 43.0–77.0)
Neutro Abs: 1 10*3/uL — ABNORMAL LOW (ref 1.4–7.7)
PLATELETS: 243 10*3/uL (ref 150.0–400.0)
RBC: 4.39 Mil/uL (ref 4.22–5.81)
RDW: 13.1 % (ref 11.5–14.6)
WBC: 2.6 10*3/uL — ABNORMAL LOW (ref 4.5–10.5)

## 2013-02-21 LAB — BASIC METABOLIC PANEL
BUN: 8 mg/dL (ref 6–23)
CO2: 31 meq/L (ref 19–32)
CREATININE: 0.9 mg/dL (ref 0.4–1.5)
Calcium: 9.4 mg/dL (ref 8.4–10.5)
Chloride: 98 mEq/L (ref 96–112)
GFR: 110.97 mL/min (ref >60.00)
GLUCOSE: 88 mg/dL (ref 70–99)
Potassium: 4 mEq/L (ref 3.5–5.1)
Sodium: 136 mEq/L (ref 135–145)

## 2013-02-21 LAB — TSH: TSH: 0.49 u[IU]/mL (ref 0.35–5.50)

## 2013-02-21 LAB — HEPATIC FUNCTION PANEL
ALK PHOS: 53 U/L (ref 39–117)
ALT: 12 U/L (ref 0–53)
AST: 20 U/L (ref 0–37)
Albumin: 4.2 g/dL (ref 3.5–5.2)
Bilirubin, Direct: 0 mg/dL (ref 0.0–0.3)
Total Bilirubin: 0.6 mg/dL (ref 0.3–1.2)
Total Protein: 7.2 g/dL (ref 6.0–8.3)

## 2013-02-21 LAB — LDL CHOLESTEROL, DIRECT: LDL DIRECT: 130.7 mg/dL

## 2013-02-21 LAB — LIPID PANEL
CHOL/HDL RATIO: 3
CHOLESTEROL: 201 mg/dL — AB (ref 0–200)
HDL: 58.3 mg/dL (ref >39.00)
Triglycerides: 103 mg/dL (ref 0.0–149.0)
VLDL: 20.6 mg/dL (ref 0.0–40.0)

## 2013-02-21 NOTE — Assessment & Plan Note (Signed)
On welchol.  Low cholesterol diet and exercise.  Follow lipid panel.

## 2013-02-21 NOTE — Assessment & Plan Note (Signed)
Blood pressure under good control.  Same medication regimen.  Follow metabolic panel.

## 2013-02-21 NOTE — Progress Notes (Signed)
Subjective:    Patient ID: Adrian Cohen, male    DOB: 07-23-1940, 73 y.o.   MRN: [id number]  HPI 74 year old male with past history of hypertension and hypercholesterolemia who comes in today for a scheduled follow up.  He states he's been doing well. He is staying active. Exercises regularly.   Breathing stable.  Sees Dr Raul Del.  Sees Dr Saralyn Pilar.  Doing well from a cardiac standpoint.   Denies any chest pain tightness or shortness of breath with increased activity or exertion.   Nexium is controlling his acid reflux symptoms.  Recently saw Dr Jacqlyn Larsen.  Diagnosed with UTI.  Treated.  Now taking Flomax.  No urinary issues currently.  He does report some bilateral shoulder pain.  No neck pain.  Some stiffness.  Minimal.  No injury or trauma.  No heavy lifting or pushing/pulling.  Not taking anything.  No radicular symptoms.  Also reports some previous left groin pain.  Started two weeks ago.  Better.  Minimal now - occasionally.  No persistent pain.  No bulging.  No abdominal pain.     Past Medical History  Diagnosis Date  . Hypertension   . Hypercholesterolemia   . Peripheral vascular disease   . COPD (chronic obstructive pulmonary disease)   . Asthma   . Anemia   . S/P colonoscopy 08.29.00    Current Outpatient Prescriptions on File Prior to Visit  Medication Sig Dispense Refill  . aspirin 81 MG tablet Take 81 mg by mouth daily.      . colesevelam (WELCHOL) 625 MG tablet Take 6 tablets (3,750 mg total) by mouth daily.  540 tablet  1  . COMBIVENT RESPIMAT 20-100 MCG/ACT AERS respimat Take 1 puff by mouth every 6 (six) hours as needed.       . fluticasone-salmeterol (ADVAIR HFA) 45-21 MCG/ACT inhaler Inhale 2 puffs into the lungs 2 (two) times daily. Inhale 2 puffs as directed every twelve hours      . hydrochlorothiazide (HYDRODIURIL) 25 MG tablet TAKE 1 TABLET DAILY  90 tablet  1  . KLOR-CON M10 10 MEQ tablet TAKE 1 TABLET DAILY  90 tablet  1  . Multiple Vitamin (MULTIVITAMIN) capsule  Take 1 capsule by mouth daily. Take 1 capsule by mouth once a day      . NASONEX 50 MCG/ACT nasal spray Place 2 sprays into the nose daily.       Marland Kitchen NEXIUM 40 MG capsule TAKE 1 CAPSULE TWICE A DAY  180 capsule  0  . Olopatadine HCl (PATANASE) 0.6 % SOLN Place into the nose.      . quinapril (ACCUPRIL) 40 MG tablet TAKE 1 TABLET DAILY  90 tablet  1  . vitamin E 400 UNIT capsule Take 400 Units by mouth daily. Take 1 by mouth once a day       No current facility-administered medications on file prior to visit.    Review of Systems Patient denies any headache, lightheadedness or dizziness.  No significant allergies or sinus symptoms. No chest pain, tightness or palpitations.  No increased shortness of breath, cough or congestion. No acid reflux, dysphagia or odynophagia. No nausea or vomiting.  No abdominal pain or cramping.  No bowel change, such as diarrhea, constipation, BRBPR or melana.  Urinary symptoms resolved.  Bilateral shoulder pain as outlined.  Left groin discomfort as outlined.  Intermittent.  No testicular pain.        Objective:   Physical Exam  Filed Vitals:   02/19/13 1611  BP: 130/70  Pulse: 84  Temp: 98.3 F (104.82 C)   73 year old male in no acute distress.  HEENT:  Nares - clear.  Oropharynx - without lesions.   NECK:  Supple.  Nontender.  No audible carotid bruit.  HEART:  Appears to be regular.   LUNGS:  No crackles or wheezing audible.  Respirations even and unlabored.   RADIAL PULSE:  Equal bilaterally.  ABDOMEN:  Soft.  Nontender.  Bowel sounds present and normal.  No audible abdominal bruit.  No hernia appreciated.  No significant tenderness to palpation - left umbilical region.   EXTREMITIES:  No increased edema present.  DP pulses palpable and equal bilaterally.   MSK:  No significant pain with full extension of his arms.  No neck tenderness.  No limited rom.         Assessment & Plan:  GU.  Treated for infection.  Symptoms resolved.  On flomax.  Doing well.   Follow.     DERMATOLOGY.  Being followed for lip lesion.     PULMONARY.  His breathing is stable. He has no shortness of breath with increased activity or exertion.  Sees Dr Raul Del.    HEALTH MAINTENANCE.  Physical 03/20/12.   He is up to date with his colonoscopy.  Sees gastroenterology.  He is up to date with prostate screening.  Followed by Dr Jacqlyn Larsen.

## 2013-02-21 NOTE — Telephone Encounter (Signed)
Pt came into office for lab work and asked if he could also get a shingles vaccine.  Advised pt to check with his insurance to see if it is covered as it is very expensive.  Advised pt that it could be cheaper at his pharmacy and we can send the script to the pharmacy if his insurance will cover the medication.

## 2013-02-22 ENCOUNTER — Encounter: Payer: Self-pay | Admitting: *Deleted

## 2013-02-22 DIAGNOSIS — M25512 Pain in left shoulder: Secondary | ICD-10-CM

## 2013-02-22 DIAGNOSIS — M25511 Pain in right shoulder: Secondary | ICD-10-CM | POA: Insufficient documentation

## 2013-02-22 DIAGNOSIS — R1032 Left lower quadrant pain: Secondary | ICD-10-CM | POA: Insufficient documentation

## 2013-02-22 NOTE — Assessment & Plan Note (Signed)
Tylenol.  Stretches.  Follow.

## 2013-02-22 NOTE — Assessment & Plan Note (Signed)
Pain as outlined.  Not severe.  No bowel change.  No hernia appreciated on exam.  Discussed further evaluation.  Wants to monitor.  Follow.

## 2013-02-22 NOTE — Assessment & Plan Note (Signed)
Exercising.  No pain.  Doing well.  Continue daily aspirin and risk factor modification.

## 2013-02-22 NOTE — Assessment & Plan Note (Signed)
Colonoscopy as outlined.  Recommend f/u colonoscopy 07/2015.

## 2013-02-22 NOTE — Assessment & Plan Note (Signed)
Evaluated by hematology.  Stable.

## 2013-02-22 NOTE — Assessment & Plan Note (Signed)
Currently asymptomatic.

## 2013-02-24 ENCOUNTER — Other Ambulatory Visit: Payer: Self-pay | Admitting: Internal Medicine

## 2013-03-12 ENCOUNTER — Other Ambulatory Visit: Payer: Self-pay | Admitting: Internal Medicine

## 2013-04-19 ENCOUNTER — Other Ambulatory Visit: Payer: Self-pay | Admitting: Internal Medicine

## 2013-05-13 ENCOUNTER — Other Ambulatory Visit: Payer: Self-pay | Admitting: Internal Medicine

## 2013-05-21 ENCOUNTER — Encounter: Payer: Self-pay | Admitting: Internal Medicine

## 2013-05-21 ENCOUNTER — Ambulatory Visit (INDEPENDENT_AMBULATORY_CARE_PROVIDER_SITE_OTHER): Payer: Medicare Other | Admitting: Internal Medicine

## 2013-05-21 ENCOUNTER — Encounter (INDEPENDENT_AMBULATORY_CARE_PROVIDER_SITE_OTHER): Payer: Self-pay

## 2013-05-21 VITALS — BP 120/70 | HR 82 | Temp 97.6°F | Ht 70.0 in | Wt 176.5 lb

## 2013-05-21 DIAGNOSIS — D72819 Decreased white blood cell count, unspecified: Secondary | ICD-10-CM

## 2013-05-21 DIAGNOSIS — I739 Peripheral vascular disease, unspecified: Secondary | ICD-10-CM

## 2013-05-21 DIAGNOSIS — I10 Essential (primary) hypertension: Secondary | ICD-10-CM

## 2013-05-21 DIAGNOSIS — Z86010 Personal history of colonic polyps: Secondary | ICD-10-CM

## 2013-05-21 DIAGNOSIS — K2990 Gastroduodenitis, unspecified, without bleeding: Secondary | ICD-10-CM

## 2013-05-21 DIAGNOSIS — E7800 Pure hypercholesterolemia, unspecified: Secondary | ICD-10-CM

## 2013-05-21 DIAGNOSIS — D649 Anemia, unspecified: Secondary | ICD-10-CM

## 2013-05-21 DIAGNOSIS — K2970 Gastritis, unspecified, without bleeding: Secondary | ICD-10-CM

## 2013-05-21 NOTE — Progress Notes (Signed)
Pre visit review using our clinic review tool, if applicable. No additional management support is needed unless otherwise documented below in the visit note. 

## 2013-05-23 ENCOUNTER — Encounter: Payer: Self-pay | Admitting: Internal Medicine

## 2013-05-23 NOTE — Assessment & Plan Note (Signed)
On welchol.  Low cholesterol diet and exercise.  Follow lipid panel.

## 2013-05-23 NOTE — Assessment & Plan Note (Signed)
Colonoscopy as outlined.  Recommended follow up colonoscopy in 2017.

## 2013-05-23 NOTE — Assessment & Plan Note (Signed)
Evaluated by hematology.  Stable.

## 2013-05-23 NOTE — Assessment & Plan Note (Signed)
Exercising.  No pain.  Doing well.  Continue daily aspirin and risk factor modification.

## 2013-05-23 NOTE — Progress Notes (Signed)
Subjective:    Patient ID: Adrian Cohen, male    DOB: [birth year], 73 y.o.   MRN: [id number]  HPI 73 year old male with past history of hypertension and hypercholesterolemia who comes in today to follow up on these issues as well as for a complete physical exam.  He states he's been doing well. He is staying active. Exercises regularly. Goes to the gym 4-5x/week.   Breathing stable.  Sees Dr Raul Del.  Sees Dr Saralyn Pilar.  Doing well from a cardiac standpoint.   Denies any chest pain tightness or shortness of breath with increased activity or exertion.   Nexium is controlling his acid reflux symptoms.  Sees Dr Jacqlyn Larsen.  No urinary issues currently.  Blood pressures averaging 130s/60-70.      Past Medical History  Diagnosis Date  . Hypertension   . Hypercholesterolemia   . Peripheral vascular disease   . COPD (chronic obstructive pulmonary disease)   . Asthma   . Anemia   . S/P colonoscopy 08.29.00    Current Outpatient Prescriptions on File Prior to Visit  Medication Sig Dispense Refill  . amLODipine (NORVASC) 10 MG tablet TAKE 1 TABLET DAILY  90 tablet  1  . aspirin 81 MG tablet Take 81 mg by mouth daily.      . colesevelam (WELCHOL) 625 MG tablet Take 6 tablets (3,750 mg total) by mouth daily.  540 tablet  1  . COMBIVENT RESPIMAT 20-100 MCG/ACT AERS respimat Take 1 puff by mouth every 6 (six) hours as needed.       Marland Kitchen esomeprazole (NEXIUM) 40 MG capsule TAKE 1 CAPSULE TWICE A DAY  180 capsule  0  . fluticasone-salmeterol (ADVAIR HFA) 45-21 MCG/ACT inhaler Inhale 2 puffs into the lungs 2 (two) times daily. Inhale 2 puffs as directed every twelve hours      . hydrochlorothiazide (HYDRODIURIL) 25 MG tablet TAKE 1 TABLET DAILY  90 tablet  1  . KLOR-CON M10 10 MEQ tablet TAKE 1 TABLET DAILY  90 tablet  1  . Multiple Vitamin (MULTIVITAMIN) capsule Take 1 capsule by mouth daily. Take 1 capsule by mouth once a day      . NASONEX 50 MCG/ACT nasal spray Place 2 sprays into the nose daily.       .  Olopatadine HCl (PATANASE) 0.6 % SOLN Place into the nose.      . quinapril (ACCUPRIL) 40 MG tablet TAKE 1 TABLET DAILY  90 tablet  1  . vitamin E 400 UNIT capsule Take 400 Units by mouth daily. Take 1 by mouth once a day       No current facility-administered medications on file prior to visit.    Review of Systems Patient denies any headache, lightheadedness or dizziness.  No significant allergies or sinus symptoms.  No chest pain, tightness or palpitations.  No increased shortness of breath, cough or congestion.  No acid reflux, dysphagia or odynophagia.  No nausea or vomiting.  No abdominal pain or cramping.  No bowel change, such as diarrhea, constipation, BRBPR or melana.  No urinary symptoms currently.   Overall he feels he is doing well.       Objective:   Physical Exam  Filed Vitals:   05/21/13 1555  BP: 120/70  Pulse: 82  Temp: 97.6 F (36.4 C)   Blood pressure recheck: 75/56  73 year old male in no acute distress.  HEENT:  Nares - clear.  Oropharynx - without lesions.   NECK:  Supple.  Nontender.  No audible  carotid bruit.  HEART:  Appears to be regular.   LUNGS:  No crackles or wheezing audible.  Respirations even and unlabored.   RADIAL PULSE:  Equal bilaterally.  ABDOMEN:  Soft.  Nontender.  Bowel sounds present and normal.  No audible abdominal bruit.    EXTREMITIES:  No increased edema present.  DP pulses palpable and equal bilaterally.         Assessment & Plan:  GU.  Doing well.  Follow.     PULMONARY.  His breathing is stable. He has no shortness of breath with increased activity or exertion.  Sees Dr Raul Del.    HEALTH MAINTENANCE.  Physical today.   He is up to date with his colonoscopy.  Sees gastroenterology.  He is up to date with prostate screening.  Followed by Dr Jacqlyn Larsen.

## 2013-05-23 NOTE — Assessment & Plan Note (Signed)
Currently asymptomatic.

## 2013-05-23 NOTE — Assessment & Plan Note (Signed)
Blood pressure under good control.  Same medication regimen.  Follow metabolic panel.

## 2013-06-06 ENCOUNTER — Other Ambulatory Visit: Payer: Self-pay | Admitting: Internal Medicine

## 2013-07-05 DIAGNOSIS — N62 Hypertrophy of breast: Secondary | ICD-10-CM | POA: Insufficient documentation

## 2013-07-10 ENCOUNTER — Other Ambulatory Visit (INDEPENDENT_AMBULATORY_CARE_PROVIDER_SITE_OTHER): Payer: Medicare Other

## 2013-07-10 DIAGNOSIS — E7800 Pure hypercholesterolemia, unspecified: Secondary | ICD-10-CM

## 2013-07-10 DIAGNOSIS — D649 Anemia, unspecified: Secondary | ICD-10-CM

## 2013-07-10 DIAGNOSIS — D72819 Decreased white blood cell count, unspecified: Secondary | ICD-10-CM

## 2013-07-10 DIAGNOSIS — I10 Essential (primary) hypertension: Secondary | ICD-10-CM

## 2013-07-10 LAB — CBC WITH DIFFERENTIAL/PLATELET
Basophils Absolute: 0 10*3/uL (ref 0.0–0.1)
Basophils Relative: 0.7 % (ref 0.0–3.0)
EOS PCT: 2.7 % (ref 0.0–5.0)
Eosinophils Absolute: 0.1 10*3/uL (ref 0.0–0.7)
HEMATOCRIT: 38.3 % — AB (ref 39.0–52.0)
Hemoglobin: 12.8 g/dL — ABNORMAL LOW (ref 13.0–17.0)
LYMPHS ABS: 0.9 10*3/uL (ref 0.7–4.0)
Lymphocytes Relative: 39.3 % (ref 12.0–46.0)
MCHC: 33.4 g/dL (ref 30.0–36.0)
MCV: 87.8 fl (ref 78.0–100.0)
MONOS PCT: 20.6 % — AB (ref 3.0–12.0)
Monocytes Absolute: 0.5 10*3/uL (ref 0.1–1.0)
Neutro Abs: 0.9 10*3/uL — ABNORMAL LOW (ref 1.4–7.7)
Neutrophils Relative %: 36.7 % — ABNORMAL LOW (ref 43.0–77.0)
Platelets: 252 10*3/uL (ref 150.0–400.0)
RBC: 4.36 Mil/uL (ref 4.22–5.81)
RDW: 12.7 % (ref 11.5–15.5)

## 2013-07-10 LAB — IBC PANEL
Iron: 109 ug/dL (ref 42–165)
SATURATION RATIOS: 33.5 % (ref 20.0–50.0)
TRANSFERRIN: 232.6 mg/dL (ref 212.0–360.0)

## 2013-07-10 LAB — LIPID PANEL
Cholesterol: 190 mg/dL (ref 0–200)
HDL: 60 mg/dL (ref >39.00)
LDL CALC: 118 mg/dL — AB (ref 0–99)
NONHDL: 130
Total CHOL/HDL Ratio: 3
Triglycerides: 61 mg/dL (ref 0.0–149.0)
VLDL: 12.2 mg/dL (ref 0.0–40.0)

## 2013-07-10 LAB — BASIC METABOLIC PANEL
BUN: 6 mg/dL (ref 6–23)
CHLORIDE: 99 meq/L (ref 96–112)
CO2: 29 meq/L (ref 19–32)
Calcium: 9.5 mg/dL (ref 8.4–10.5)
Creatinine, Ser: 0.8 mg/dL (ref 0.4–1.5)
GFR: 123.91 mL/min (ref >60.00)
Glucose, Bld: 91 mg/dL (ref 70–99)
POTASSIUM: 4.2 meq/L (ref 3.5–5.1)
SODIUM: 135 meq/L (ref 135–145)

## 2013-07-10 LAB — HEPATIC FUNCTION PANEL
ALT: 12 U/L (ref 0–53)
AST: 21 U/L (ref 0–37)
Albumin: 4.1 g/dL (ref 3.5–5.2)
Alkaline Phosphatase: 50 U/L (ref 39–117)
Bilirubin, Direct: 0.1 mg/dL (ref 0.0–0.3)
Total Bilirubin: 0.6 mg/dL (ref 0.2–1.2)
Total Protein: 6.4 g/dL (ref 6.0–8.3)

## 2013-07-10 LAB — FERRITIN: Ferritin: 104 ng/mL (ref 22.0–322.0)

## 2013-07-11 ENCOUNTER — Encounter: Payer: Self-pay | Admitting: *Deleted

## 2013-07-11 ENCOUNTER — Other Ambulatory Visit: Payer: Medicare Other

## 2013-08-10 ENCOUNTER — Ambulatory Visit: Payer: Self-pay | Admitting: Surgery

## 2013-08-10 LAB — HM MAMMOGRAPHY

## 2013-08-22 ENCOUNTER — Other Ambulatory Visit: Payer: Self-pay | Admitting: *Deleted

## 2013-08-22 MED ORDER — HYDROCHLOROTHIAZIDE 25 MG PO TABS: ORAL_TABLET | ORAL | Status: DC

## 2013-09-17 ENCOUNTER — Other Ambulatory Visit: Payer: Self-pay | Admitting: Internal Medicine

## 2013-09-18 ENCOUNTER — Telehealth: Payer: Self-pay | Admitting: Internal Medicine

## 2013-09-18 NOTE — Telephone Encounter (Signed)
Needing a prior authorization for esomeprazole (NEXIUM) 40 MG capsule

## 2013-09-20 NOTE — Telephone Encounter (Signed)
PA initiated via [employer]

## 2013-10-01 ENCOUNTER — Telehealth: Payer: Self-pay | Admitting: *Deleted

## 2013-10-01 MED ORDER — ESOMEPRAZOLE MAGNESIUM 40 MG PO CPDR: DELAYED_RELEASE_CAPSULE | ORAL | Status: DC

## 2013-10-01 NOTE — Telephone Encounter (Signed)
A user error has taken place.

## 2013-10-10 ENCOUNTER — Other Ambulatory Visit: Payer: Self-pay | Admitting: *Deleted

## 2013-10-10 MED ORDER — ESOMEPRAZOLE MAGNESIUM 40 MG PO CPDR: DELAYED_RELEASE_CAPSULE | ORAL | Status: DC

## 2013-10-15 NOTE — Telephone Encounter (Signed)
Questionnaire given to Dr. Nicki Reaper

## 2013-10-16 NOTE — Telephone Encounter (Signed)
PA Questionnaire faxed

## 2013-10-19 NOTE — Telephone Encounter (Signed)
Fax from [employer], PA approved through 10/18/14

## 2013-10-22 ENCOUNTER — Ambulatory Visit (INDEPENDENT_AMBULATORY_CARE_PROVIDER_SITE_OTHER): Payer: Medicare Other | Admitting: Internal Medicine

## 2013-10-22 ENCOUNTER — Encounter: Payer: Self-pay | Admitting: Internal Medicine

## 2013-10-22 VITALS — BP 110/62 | HR 86 | Temp 98.0°F | Ht 70.0 in | Wt 168.5 lb

## 2013-10-22 DIAGNOSIS — R519 Headache, unspecified: Secondary | ICD-10-CM

## 2013-10-22 DIAGNOSIS — E7800 Pure hypercholesterolemia, unspecified: Secondary | ICD-10-CM

## 2013-10-22 DIAGNOSIS — K2990 Gastroduodenitis, unspecified, without bleeding: Secondary | ICD-10-CM

## 2013-10-22 DIAGNOSIS — I739 Peripheral vascular disease, unspecified: Secondary | ICD-10-CM

## 2013-10-22 DIAGNOSIS — Z86010 Personal history of colonic polyps: Secondary | ICD-10-CM

## 2013-10-22 DIAGNOSIS — K2970 Gastritis, unspecified, without bleeding: Secondary | ICD-10-CM

## 2013-10-22 DIAGNOSIS — I10 Essential (primary) hypertension: Secondary | ICD-10-CM

## 2013-10-22 DIAGNOSIS — R51 Headache: Secondary | ICD-10-CM

## 2013-10-22 DIAGNOSIS — Z23 Encounter for immunization: Secondary | ICD-10-CM

## 2013-10-22 DIAGNOSIS — D72819 Decreased white blood cell count, unspecified: Secondary | ICD-10-CM

## 2013-10-22 MED ORDER — MUPIROCIN CALCIUM 2 % EX CREA: 1.0000 "application " | TOPICAL_CREAM | Freq: Two times a day (BID) | CUTANEOUS | Status: DC

## 2013-10-22 NOTE — Progress Notes (Signed)
Pre visit review using our clinic review tool, if applicable. No additional management support is needed unless otherwise documented below in the visit note. 

## 2013-10-28 ENCOUNTER — Encounter: Payer: Self-pay | Admitting: Internal Medicine

## 2013-10-28 DIAGNOSIS — R51 Headache: Secondary | ICD-10-CM

## 2013-10-28 DIAGNOSIS — R519 Headache, unspecified: Secondary | ICD-10-CM | POA: Insufficient documentation

## 2013-10-28 NOTE — Assessment & Plan Note (Signed)
Exercising.  No pain.  Doing well.  Continue daily aspirin and risk factor modification.

## 2013-10-28 NOTE — Assessment & Plan Note (Signed)
Previously noticed quick pain as outlined. None now.  Desires no further evaluation at this time.  Follow.

## 2013-10-28 NOTE — Assessment & Plan Note (Signed)
Currently asymptomatic.  EGD 07/11/12 - gastritis.

## 2013-10-28 NOTE — Progress Notes (Signed)
Subjective:    Patient ID: Adrian Cohen, male    DOB: 06-May-1940, 73 y.o.   MRN: [id number]  HPI 73 year old male with past history of hypertension and hypercholesterolemia who comes in today for a scheduled follow up.  He states he's been doing well. He is staying active. Exercises regularly. Goes to the gym.  Breathing stable.  Sees Dr Raul Del.  Sees Dr Saralyn Pilar.  Doing well from a cardiac standpoint.   Denies any chest pain tightness or shortness of breath with increased activity or exertion.   Nexium is controlling his acid reflux symptoms.  Sees Dr Jacqlyn Larsen.  No urinary issues currently.  Blood pressure doing well.  Apparently planning to have surgery soon - Dr Pat Patrick.  Need records.  Has seen dermatology for lip swelling/fullness.  Unclear etiology.  They did not feel biopsy warranted.  States previously had - quick pain - head.  Will just shoot through.  Only last for a couple of seconds.  Last occurred two days ago.  Desires no further w/up currently.  Wants to monitor.  Overall feels good.      Past Medical History  Diagnosis Date  . Hypertension   . Hypercholesterolemia   . Peripheral vascular disease   . COPD (chronic obstructive pulmonary disease)   . Asthma   . Anemia   . S/P colonoscopy 08.29.00    Current Outpatient Prescriptions on File Prior to Visit  Medication Sig Dispense Refill  . alfuzosin (UROXATRAL) 10 MG 24 hr tablet Take 10 mg by mouth daily with breakfast.      . amLODipine (NORVASC) 10 MG tablet TAKE 1 TABLET DAILY  90 tablet  1  . aspirin 81 MG tablet Take 81 mg by mouth daily.      . COMBIVENT RESPIMAT 20-100 MCG/ACT AERS respimat Take 1 puff by mouth every 6 (six) hours as needed.       Marland Kitchen esomeprazole (NEXIUM) 40 MG capsule TAKE 1 CAPSULE TWICE A DAY  180 capsule  2  . fluticasone-salmeterol (ADVAIR HFA) 45-21 MCG/ACT inhaler Inhale 2 puffs into the lungs 2 (two) times daily. Inhale 2 puffs as directed every twelve hours      . hydrochlorothiazide (HYDRODIURIL)  25 MG tablet TAKE 1 TABLET DAILY  90 tablet  1  . KLOR-CON M10 10 MEQ tablet TAKE 1 TABLET DAILY  90 tablet  1  . Multiple Vitamin (MULTIVITAMIN) capsule Take 1 capsule by mouth daily. Take 1 capsule by mouth once a day      . NASONEX 50 MCG/ACT nasal spray Place 2 sprays into the nose daily.       . Olopatadine HCl (PATANASE) 0.6 % SOLN Place into the nose.      . quinapril (ACCUPRIL) 40 MG tablet TAKE 1 TABLET DAILY  90 tablet  1  . vitamin E 400 UNIT capsule Take 400 Units by mouth daily. Take 1 by mouth once a day      . WELCHOL 625 MG tablet TAKE 6 TABLETS (3,750 MG TOTAL) DAILY  540 tablet  1   No current facility-administered medications on file prior to visit.    Review of Systems Patient denies any headache, lightheadedness or dizziness.  Does report previously noticing the quick head pain as outlined.  No pain now.  No significant allergies or sinus symptoms.  No chest pain, tightness or palpitations.  No increased shortness of breath, cough or congestion.  No acid reflux, dysphagia or odynophagia.  No nausea or vomiting.  No  abdominal pain or cramping.  No bowel change, such as diarrhea, constipation, BRBPR or melana.  No urinary symptoms currently.   Overall he feels he is doing well.       Objective:   Physical Exam  Filed Vitals:   10/22/13 1522  BP: 110/62  Pulse: 86  Temp: 98 F (36.7 C)   Blood pressure recheck: 51/56  73 year old male in no acute distress.  HEENT:  Nares - clear.  Oropharynx - without lesions.   NECK:  Supple.  Nontender.  No audible carotid bruit.  HEART:  Appears to be regular.   LUNGS:  No crackles or wheezing audible.  Respirations even and unlabored.   RADIAL PULSE:  Equal bilaterally.  ABDOMEN:  Soft.  Nontender.  Bowel sounds present and normal.  No audible abdominal bruit.    EXTREMITIES:  No increased edema present.  DP pulses palpable and equal bilaterally.         Assessment & Plan:  GU.  Doing well.  Follow.     PULMONARY.  His  breathing is stable. He has no shortness of breath with increased activity or exertion.  Sees Dr Raul Del.    HEALTH MAINTENANCE.  Physical 05/21/13.   He is up to date with his colonoscopy.  Sees gastroenterology.  He is up to date with prostate screening.  Followed by Dr Jacqlyn Larsen.

## 2013-10-28 NOTE — Assessment & Plan Note (Signed)
Evaluated by hematology.  Stable.

## 2013-10-28 NOTE — Assessment & Plan Note (Signed)
Colonoscopy as outlined.  Recommended follow up colonoscopy in 2017.

## 2013-10-28 NOTE — Assessment & Plan Note (Signed)
On welchol.  Low cholesterol diet and exercise.  Follow lipid panel.

## 2013-10-28 NOTE — Assessment & Plan Note (Signed)
Blood pressure under good control.  Same medication regimen.  Follow metabolic panel.

## 2013-11-22 ENCOUNTER — Other Ambulatory Visit (INDEPENDENT_AMBULATORY_CARE_PROVIDER_SITE_OTHER): Payer: Medicare Other

## 2013-11-22 DIAGNOSIS — I10 Essential (primary) hypertension: Secondary | ICD-10-CM

## 2013-11-22 DIAGNOSIS — E7800 Pure hypercholesterolemia, unspecified: Secondary | ICD-10-CM

## 2013-11-22 DIAGNOSIS — D72819 Decreased white blood cell count, unspecified: Secondary | ICD-10-CM

## 2013-11-22 LAB — BASIC METABOLIC PANEL
BUN: 8 mg/dL (ref 6–23)
CHLORIDE: 97 meq/L (ref 96–112)
CO2: 31 meq/L (ref 19–32)
CREATININE: 1 mg/dL (ref 0.4–1.5)
Calcium: 9.4 mg/dL (ref 8.4–10.5)
GFR: 100.05 mL/min (ref >60.00)
GLUCOSE: 77 mg/dL (ref 70–99)
Potassium: 3.7 mEq/L (ref 3.5–5.1)
Sodium: 135 mEq/L (ref 135–145)

## 2013-11-22 LAB — CBC WITH DIFFERENTIAL/PLATELET
BASOS PCT: 0.6 % (ref 0.0–3.0)
Basophils Absolute: 0 10*3/uL (ref 0.0–0.1)
Eosinophils Absolute: 0.1 10*3/uL (ref 0.0–0.7)
Eosinophils Relative: 2.7 % (ref 0.0–5.0)
HCT: 42.3 % (ref 39.0–52.0)
HEMOGLOBIN: 14 g/dL (ref 13.0–17.0)
Lymphocytes Relative: 41.8 % (ref 12.0–46.0)
Lymphs Abs: 1.4 10*3/uL (ref 0.7–4.0)
MCHC: 33.1 g/dL (ref 30.0–36.0)
MCV: 88.5 fl (ref 78.0–100.0)
MONOS PCT: 14.4 % — AB (ref 3.0–12.0)
Monocytes Absolute: 0.5 10*3/uL (ref 0.1–1.0)
Neutro Abs: 1.3 10*3/uL — ABNORMAL LOW (ref 1.4–7.7)
Neutrophils Relative %: 40.5 % — ABNORMAL LOW (ref 43.0–77.0)
Platelets: 226 10*3/uL (ref 150.0–400.0)
RBC: 4.78 Mil/uL (ref 4.22–5.81)
RDW: 12.9 % (ref 11.5–15.5)
WBC: 3.3 10*3/uL — ABNORMAL LOW (ref 4.0–10.5)

## 2013-11-22 LAB — HEPATIC FUNCTION PANEL
ALBUMIN: 3.7 g/dL (ref 3.5–5.2)
ALT: 11 U/L (ref 0–53)
AST: 18 U/L (ref 0–37)
Alkaline Phosphatase: 51 U/L (ref 39–117)
BILIRUBIN TOTAL: 0.9 mg/dL (ref 0.2–1.2)
Bilirubin, Direct: 0 mg/dL (ref 0.0–0.3)
Total Protein: 7.5 g/dL (ref 6.0–8.3)

## 2013-11-22 LAB — LIPID PANEL
CHOL/HDL RATIO: 3
Cholesterol: 204 mg/dL — ABNORMAL HIGH (ref 0–200)
HDL: 60 mg/dL (ref >39.00)
LDL Cholesterol: 126 mg/dL — ABNORMAL HIGH (ref 0–99)
NonHDL: 144
Triglycerides: 88 mg/dL (ref 0.0–149.0)
VLDL: 17.6 mg/dL (ref 0.0–40.0)

## 2013-11-22 LAB — FERRITIN: FERRITIN: 113.6 ng/mL (ref 22.0–322.0)

## 2013-11-23 ENCOUNTER — Encounter: Payer: Self-pay | Admitting: *Deleted

## 2014-02-04 ENCOUNTER — Other Ambulatory Visit: Payer: Self-pay | Admitting: Internal Medicine

## 2014-02-12 ENCOUNTER — Telehealth: Payer: Self-pay

## 2014-02-12 ENCOUNTER — Telehealth: Payer: Self-pay | Admitting: Internal Medicine

## 2014-02-12 MED ORDER — COLESEVELAM HCL 625 MG PO TABS: ORAL_TABLET | ORAL | Status: DC

## 2014-02-12 NOTE — Telephone Encounter (Signed)
Rx sent to pharmacy by escript  

## 2014-02-12 NOTE — Telephone Encounter (Signed)
The patient called and is hoping to get a refill on his [employer] (spelling?) medication (i dont see this medication listed on his med list) Adrian Cohen [address] [phone]

## 2014-02-21 ENCOUNTER — Ambulatory Visit (INDEPENDENT_AMBULATORY_CARE_PROVIDER_SITE_OTHER): Payer: Medicare Other | Admitting: Internal Medicine

## 2014-02-21 ENCOUNTER — Encounter: Payer: Self-pay | Admitting: Internal Medicine

## 2014-02-21 VITALS — BP 118/60 | HR 102 | Temp 97.4°F | Ht 70.0 in | Wt 181.5 lb

## 2014-02-21 DIAGNOSIS — M25511 Pain in right shoulder: Secondary | ICD-10-CM

## 2014-02-21 DIAGNOSIS — I10 Essential (primary) hypertension: Secondary | ICD-10-CM

## 2014-02-21 DIAGNOSIS — M25512 Pain in left shoulder: Secondary | ICD-10-CM

## 2014-02-21 DIAGNOSIS — D649 Anemia, unspecified: Secondary | ICD-10-CM | POA: Insufficient documentation

## 2014-02-21 DIAGNOSIS — D72819 Decreased white blood cell count, unspecified: Secondary | ICD-10-CM

## 2014-02-21 DIAGNOSIS — M25519 Pain in unspecified shoulder: Secondary | ICD-10-CM | POA: Insufficient documentation

## 2014-02-21 DIAGNOSIS — K2970 Gastritis, unspecified, without bleeding: Secondary | ICD-10-CM

## 2014-02-21 DIAGNOSIS — Z86010 Personal history of colonic polyps: Secondary | ICD-10-CM

## 2014-02-21 DIAGNOSIS — E7800 Pure hypercholesterolemia, unspecified: Secondary | ICD-10-CM

## 2014-02-21 DIAGNOSIS — I739 Peripheral vascular disease, unspecified: Secondary | ICD-10-CM

## 2014-02-21 LAB — CBC WITH DIFFERENTIAL/PLATELET
BASOS PCT: 0 % (ref 0–1)
Basophils Absolute: 0 10*3/uL (ref 0.0–0.1)
Eosinophils Absolute: 0 10*3/uL (ref 0.0–0.7)
Eosinophils Relative: 1 % (ref 0–5)
HCT: 38.9 % — ABNORMAL LOW (ref 39.0–52.0)
Hemoglobin: 13.4 g/dL (ref 13.0–17.0)
LYMPHS PCT: 35 % (ref 12–46)
Lymphs Abs: 1.1 10*3/uL (ref 0.7–4.0)
MCH: 29.6 pg (ref 26.0–34.0)
MCHC: 34.4 g/dL (ref 30.0–36.0)
MCV: 86.1 fL (ref 78.0–100.0)
MPV: 9.2 fL (ref 8.6–12.4)
Monocytes Absolute: 0.4 10*3/uL (ref 0.1–1.0)
Monocytes Relative: 12 % (ref 3–12)
NEUTROS ABS: 1.6 10*3/uL — AB (ref 1.7–7.7)
Neutrophils Relative %: 52 % (ref 43–77)
Platelets: 273 10*3/uL (ref 150–400)
RBC: 4.52 MIL/uL (ref 4.22–5.81)
RDW: 13.1 % (ref 11.5–15.5)
WBC: 3 10*3/uL — ABNORMAL LOW (ref 4.0–10.5)

## 2014-02-21 LAB — BASIC METABOLIC PANEL
BUN: 8 mg/dL (ref 6–23)
CALCIUM: 9.2 mg/dL (ref 8.4–10.5)
CO2: 28 mEq/L (ref 19–32)
CREATININE: 0.87 mg/dL (ref 0.50–1.35)
Chloride: 96 mEq/L (ref 96–112)
Glucose, Bld: 112 mg/dL — ABNORMAL HIGH (ref 70–99)
POTASSIUM: 4.1 meq/L (ref 3.5–5.3)
Sodium: 133 mEq/L — ABNORMAL LOW (ref 135–145)

## 2014-02-21 LAB — C-REACTIVE PROTEIN: CRP: <0.5 mg/dL (ref <0.60)

## 2014-02-21 NOTE — Progress Notes (Signed)
Pre visit review using our clinic review tool, if applicable. No additional management support is needed unless otherwise documented below in the visit note. 

## 2014-02-21 NOTE — Progress Notes (Signed)
Subjective:    Patient ID: Adrian Cohen, male    DOB: 1940/09/22, 74 y.o.   MRN: [id number]  HPI 74 year old male with past history of hypertension and hypercholesterolemia who comes in today for a scheduled follow up.  He states he's been doing well. He is staying active. Exercises regularly. Goes to the gym.  Breathing stable.  Sees Dr Raul Del.  Sees Dr Saralyn Pilar.  Doing well from a cardiac standpoint.   Denies any chest pain tightness or shortness of breath with increased activity or exertion.   Nexium is controlling his acid reflux symptoms. Sees Dr Jacqlyn Larsen.  No urinary issues currently.  Blood pressure doing well.  Was planning to have surgery - Dr Pat Patrick.  Had to postpone.  Plans to have this in the future. His main complaint is that of bilateral shoulder pain.  Hurts if he holds his arm in the air.  No pain when arms are down.  No neck pain.  No numbness or tingling.        Past Medical History  Diagnosis Date  . Hypertension   . Hypercholesterolemia   . Peripheral vascular disease   . COPD (chronic obstructive pulmonary disease)   . Asthma   . Anemia   . S/P colonoscopy 08.29.00    Current Outpatient Prescriptions on File Prior to Visit  Medication Sig Dispense Refill  . amLODipine (NORVASC) 10 MG tablet TAKE 1 TABLET DAILY 90 tablet 1  . aspirin 81 MG tablet Take 81 mg by mouth daily.    . colesevelam (WELCHOL) 625 MG tablet TAKE 6 TABLETS (3750 MG TOTAL) DAILY 180 tablet 2  . COMBIVENT RESPIMAT 20-100 MCG/ACT AERS respimat Take 1 puff by mouth every 6 (six) hours as needed.     Marland Kitchen esomeprazole (NEXIUM) 40 MG capsule TAKE 1 CAPSULE TWICE A DAY 180 capsule 2  . fluticasone-salmeterol (ADVAIR HFA) 45-21 MCG/ACT inhaler Inhale 2 puffs into the lungs 2 (two) times daily. Inhale 2 puffs as directed every twelve hours    . hydrochlorothiazide (HYDRODIURIL) 25 MG tablet TAKE 1 TABLET DAILY 90 tablet 1  . KLOR-CON M10 10 MEQ tablet TAKE 1 TABLET DAILY 90 tablet 1  . Multiple Vitamin  (MULTIVITAMIN) capsule Take 1 capsule by mouth daily. Take 1 capsule by mouth once a day    . mupirocin cream (BACTROBAN) 2 % Apply 1 application topically 2 (two) times daily. 15 g 0  . NASONEX 50 MCG/ACT nasal spray Place 2 sprays into the nose daily.     . Olopatadine HCl (PATANASE) 0.6 % SOLN Place into the nose.    . quinapril (ACCUPRIL) 40 MG tablet TAKE 1 TABLET DAILY 90 tablet 1  . vitamin E 400 UNIT capsule Take 400 Units by mouth daily. Take 1 by mouth once a day     No current facility-administered medications on file prior to visit.    Review of Systems Patient denies any headache, lightheadedness or dizziness.  No significant allergies or sinus symptoms.  No chest pain, tightness or palpitations.  No increased shortness of breath, cough or congestion.  No acid reflux, dysphagia or odynophagia.  No nausea or vomiting.  No abdominal pain or cramping.  No bowel change, such as diarrhea, constipation, BRBPR or melana.  No urinary symptoms.   Overall he feels he is doing well.  Main complaint is that of bilateral shoulder pain.  See above.  No neck pain.       Objective:   Physical Exam  Filed Vitals:  02/21/14 1445  BP: 118/60  Pulse: 102  Temp: 97.4 F (36.3 C)   Pulse recheck:  41  74 year old male in no acute distress.  HEENT:  Nares - clear.  Oropharynx - without lesions.   NECK:  Supple.  Nontender.  No audible carotid bruit.  HEART:  Appears to be regular.   LUNGS:  No crackles or wheezing audible.  Respirations even and unlabored.   RADIAL PULSE:  Equal bilaterally.  ABDOMEN:  Soft.  Nontender.  Bowel sounds present and normal.  No audible abdominal bruit.    EXTREMITIES:  No increased edema present.  DP pulses palpable and equal bilaterally.  MSK:  No pain with rotation of his arms.  Motor strength equal bilateral upper extremities.  No neck pain with rotation of his head.         Assessment & Plan:  1. Bilateral shoulder pain Unclear etiology.  Bilateral.  No  neck pain.  Good rom.  Only hurts when he has held his arm up in the air for a while.  Check inflammatory markers.  Further w/up pending results.   - Sedimentation rate - C-reactive protein - ANA  2. Anemia, unspecified anemia type Hgb 02/21/14 - 13.4.  Follow.   - CBC with Differential - Ferritin  3. Essential hypertension Blood pressure doing well.  Follow.  Continue same medication regimen.   - Basic metabolic panel  4. History of colon polyps Colonoscopy 07/11/12 - tubular adenoma.  Recommended f/u colonoscopy 07/2015.    5. Leukopenia White count is low but stable.  Follow.    6. Hypercholesterolemia Low cholesterol diet and exercise.  On wellchol.  Had intolerance to statin medication.  Follow.    7. Gastritis EGD 07/11/12 - pathology - gastritis, negative H. Pylori.  Currently asymptomatic.    8. Peripheral vascular disease Exercising.  No pain.  Doing well.  Continue daily aspirin and risk factor modification.    9. GU.  Doing well.  Follow.     10. PULMONARY.  His breathing is stable. He has no shortness of breath with increased activity or exertion.  Sees Dr Raul Del.    HEALTH MAINTENANCE.  Physical 05/21/13.   He is up to date with his colonoscopy.  Sees gastroenterology.  He is up to date with prostate screening.  Followed by Dr Jacqlyn Larsen.

## 2014-02-22 ENCOUNTER — Encounter: Payer: Self-pay | Admitting: Internal Medicine

## 2014-02-22 LAB — SEDIMENTATION RATE: Sed Rate: 1 mm/hr (ref 0–16)

## 2014-02-22 LAB — FERRITIN: Ferritin: 136 ng/mL (ref 22–322)

## 2014-02-22 LAB — ANA: Anti Nuclear Antibody(ANA): NEGATIVE

## 2014-02-25 ENCOUNTER — Other Ambulatory Visit: Payer: Self-pay | Admitting: Internal Medicine

## 2014-02-25 DIAGNOSIS — M25512 Pain in left shoulder: Principal | ICD-10-CM

## 2014-02-25 DIAGNOSIS — M25511 Pain in right shoulder: Secondary | ICD-10-CM

## 2014-02-25 DIAGNOSIS — E871 Hypo-osmolality and hyponatremia: Secondary | ICD-10-CM

## 2014-02-25 NOTE — Progress Notes (Signed)
Order placed for ortho referral.   

## 2014-02-25 NOTE — Progress Notes (Signed)
Order placed for f/u sodium.  ?

## 2014-03-04 ENCOUNTER — Other Ambulatory Visit (INDEPENDENT_AMBULATORY_CARE_PROVIDER_SITE_OTHER): Payer: Medicare Other

## 2014-03-04 DIAGNOSIS — E871 Hypo-osmolality and hyponatremia: Secondary | ICD-10-CM

## 2014-03-05 LAB — SODIUM: Sodium: 132 mEq/L — ABNORMAL LOW (ref 135–145)

## 2014-03-06 ENCOUNTER — Other Ambulatory Visit: Payer: Self-pay | Admitting: Internal Medicine

## 2014-03-06 ENCOUNTER — Encounter: Payer: Self-pay | Admitting: *Deleted

## 2014-03-06 DIAGNOSIS — E871 Hypo-osmolality and hyponatremia: Secondary | ICD-10-CM

## 2014-03-06 NOTE — Progress Notes (Signed)
Order placed for f/u sodium.  ?

## 2014-03-11 ENCOUNTER — Other Ambulatory Visit: Payer: Self-pay | Admitting: Internal Medicine

## 2014-03-13 ENCOUNTER — Other Ambulatory Visit: Payer: Self-pay | Admitting: *Deleted

## 2014-03-13 MED ORDER — HYDROCHLOROTHIAZIDE 25 MG PO TABS: ORAL_TABLET | ORAL | Status: DC

## 2014-04-01 ENCOUNTER — Other Ambulatory Visit (INDEPENDENT_AMBULATORY_CARE_PROVIDER_SITE_OTHER): Payer: Medicare Other

## 2014-04-01 DIAGNOSIS — E871 Hypo-osmolality and hyponatremia: Secondary | ICD-10-CM

## 2014-04-01 LAB — SODIUM: SODIUM: 131 meq/L — AB (ref 135–145)

## 2014-04-02 ENCOUNTER — Encounter: Payer: Self-pay | Admitting: *Deleted

## 2014-04-02 ENCOUNTER — Other Ambulatory Visit: Payer: Self-pay | Admitting: Internal Medicine

## 2014-04-02 DIAGNOSIS — I10 Essential (primary) hypertension: Secondary | ICD-10-CM

## 2014-04-02 DIAGNOSIS — D72819 Decreased white blood cell count, unspecified: Secondary | ICD-10-CM

## 2014-04-02 DIAGNOSIS — D649 Anemia, unspecified: Secondary | ICD-10-CM

## 2014-04-02 DIAGNOSIS — E7800 Pure hypercholesterolemia, unspecified: Secondary | ICD-10-CM

## 2014-04-02 DIAGNOSIS — E871 Hypo-osmolality and hyponatremia: Secondary | ICD-10-CM

## 2014-04-02 NOTE — Progress Notes (Signed)
Order placed for f/u labs.  

## 2014-04-03 ENCOUNTER — Other Ambulatory Visit: Payer: Medicare Other

## 2014-04-19 ENCOUNTER — Other Ambulatory Visit: Payer: Self-pay | Admitting: Internal Medicine

## 2014-04-23 ENCOUNTER — Other Ambulatory Visit (INDEPENDENT_AMBULATORY_CARE_PROVIDER_SITE_OTHER): Payer: Medicare Other

## 2014-04-23 DIAGNOSIS — I10 Essential (primary) hypertension: Secondary | ICD-10-CM | POA: Diagnosis not present

## 2014-04-23 DIAGNOSIS — E871 Hypo-osmolality and hyponatremia: Secondary | ICD-10-CM | POA: Diagnosis not present

## 2014-04-23 DIAGNOSIS — E7800 Pure hypercholesterolemia, unspecified: Secondary | ICD-10-CM

## 2014-04-23 DIAGNOSIS — D649 Anemia, unspecified: Secondary | ICD-10-CM

## 2014-04-23 LAB — CBC WITH DIFFERENTIAL/PLATELET
Basophils Absolute: 0 10*3/uL (ref 0.0–0.1)
Basophils Relative: 0.6 % (ref 0.0–3.0)
EOS ABS: 0.1 10*3/uL (ref 0.0–0.7)
Eosinophils Relative: 3.5 % (ref 0.0–5.0)
HCT: 40.6 % (ref 39.0–52.0)
HEMOGLOBIN: 13.8 g/dL (ref 13.0–17.0)
LYMPHS PCT: 35.7 % (ref 12.0–46.0)
Lymphs Abs: 1 10*3/uL (ref 0.7–4.0)
MCHC: 34 g/dL (ref 30.0–36.0)
MCV: 86.5 fl (ref 78.0–100.0)
MONOS PCT: 17.4 % — AB (ref 3.0–12.0)
Monocytes Absolute: 0.5 10*3/uL (ref 0.1–1.0)
Neutro Abs: 1.2 10*3/uL — ABNORMAL LOW (ref 1.4–7.7)
Neutrophils Relative %: 42.8 % — ABNORMAL LOW (ref 43.0–77.0)
Platelets: 266 10*3/uL (ref 150.0–400.0)
RBC: 4.7 Mil/uL (ref 4.22–5.81)
RDW: 13.2 % (ref 11.5–15.5)
WBC: 2.9 10*3/uL — ABNORMAL LOW (ref 4.0–10.5)

## 2014-04-23 LAB — BASIC METABOLIC PANEL
BUN: 7 mg/dL (ref 6–23)
CALCIUM: 9.6 mg/dL (ref 8.4–10.5)
CO2: 31 mEq/L (ref 19–32)
Chloride: 97 mEq/L (ref 96–112)
Creatinine, Ser: 0.88 mg/dL (ref 0.40–1.50)
GFR: 109.16 mL/min (ref >60.00)
Glucose, Bld: 90 mg/dL (ref 70–99)
Potassium: 4 mEq/L (ref 3.5–5.1)
SODIUM: 134 meq/L — AB (ref 135–145)

## 2014-04-23 LAB — LIPID PANEL
Cholesterol: 204 mg/dL — ABNORMAL HIGH (ref 0–200)
HDL: 68.6 mg/dL (ref >39.00)
LDL CALC: 117 mg/dL — AB (ref 0–99)
NonHDL: 135.4
Total CHOL/HDL Ratio: 3
Triglycerides: 92 mg/dL (ref 0.0–149.0)
VLDL: 18.4 mg/dL (ref 0.0–40.0)

## 2014-04-23 LAB — HEPATIC FUNCTION PANEL
ALT: 11 U/L (ref 0–53)
AST: 20 U/L (ref 0–37)
Albumin: 4.4 g/dL (ref 3.5–5.2)
Alkaline Phosphatase: 53 U/L (ref 39–117)
BILIRUBIN TOTAL: 0.6 mg/dL (ref 0.2–1.2)
Bilirubin, Direct: 0.1 mg/dL (ref 0.0–0.3)
Total Protein: 7.1 g/dL (ref 6.0–8.3)

## 2014-04-23 LAB — TSH: TSH: 1 u[IU]/mL (ref 0.35–4.50)

## 2014-04-23 LAB — FERRITIN: Ferritin: 81.5 ng/mL (ref 22.0–322.0)

## 2014-04-29 ENCOUNTER — Telehealth: Payer: Self-pay | Admitting: Internal Medicine

## 2014-04-29 NOTE — Telephone Encounter (Signed)
The patient wants to talk to Dr. Nicki Reaper to see if there is a test to see the cause for the enlargement of his breast.

## 2014-05-02 NOTE — Telephone Encounter (Signed)
Notify pt that I will d/w him at his upcoming appt and we can check needed labs then, since he has already had labs drawn for prior to the appt.

## 2014-05-03 NOTE — Telephone Encounter (Signed)
Pt notified and verbalized understanding.

## 2014-05-09 ENCOUNTER — Encounter: Payer: Self-pay | Admitting: Internal Medicine

## 2014-05-09 ENCOUNTER — Ambulatory Visit (INDEPENDENT_AMBULATORY_CARE_PROVIDER_SITE_OTHER): Payer: Medicare Other | Admitting: Internal Medicine

## 2014-05-09 VITALS — BP 118/98 | HR 91 | Temp 98.3°F | Ht 70.5 in | Wt 178.1 lb

## 2014-05-09 DIAGNOSIS — D72819 Decreased white blood cell count, unspecified: Secondary | ICD-10-CM

## 2014-05-09 DIAGNOSIS — M25512 Pain in left shoulder: Secondary | ICD-10-CM

## 2014-05-09 DIAGNOSIS — Z23 Encounter for immunization: Secondary | ICD-10-CM

## 2014-05-09 DIAGNOSIS — Z0000 Encounter for general adult medical examination without abnormal findings: Secondary | ICD-10-CM | POA: Diagnosis not present

## 2014-05-09 DIAGNOSIS — N62 Hypertrophy of breast: Secondary | ICD-10-CM | POA: Diagnosis not present

## 2014-05-09 DIAGNOSIS — K2970 Gastritis, unspecified, without bleeding: Secondary | ICD-10-CM

## 2014-05-09 DIAGNOSIS — M25511 Pain in right shoulder: Secondary | ICD-10-CM

## 2014-05-09 DIAGNOSIS — I10 Essential (primary) hypertension: Secondary | ICD-10-CM

## 2014-05-09 DIAGNOSIS — Z86010 Personal history of colonic polyps: Secondary | ICD-10-CM

## 2014-05-09 DIAGNOSIS — I739 Peripheral vascular disease, unspecified: Secondary | ICD-10-CM

## 2014-05-09 DIAGNOSIS — E7800 Pure hypercholesterolemia, unspecified: Secondary | ICD-10-CM

## 2014-05-09 NOTE — Progress Notes (Signed)
Pre visit review using our clinic review tool, if applicable. No additional management support is needed unless otherwise documented below in the visit note. 

## 2014-05-10 LAB — TESTOSTERONE: Testosterone: 273.38 ng/dL — ABNORMAL LOW (ref 300.00–890.00)

## 2014-05-13 ENCOUNTER — Encounter: Payer: Self-pay | Admitting: Internal Medicine

## 2014-05-13 DIAGNOSIS — Z0000 Encounter for general adult medical examination without abnormal findings: Secondary | ICD-10-CM | POA: Insufficient documentation

## 2014-05-13 DIAGNOSIS — N62 Hypertrophy of breast: Secondary | ICD-10-CM | POA: Insufficient documentation

## 2014-05-13 NOTE — Assessment & Plan Note (Signed)
Colonoscopy 07/11/12 as outlined.  Recommend f/u colonoscopy 07/2015.

## 2014-05-13 NOTE — Progress Notes (Signed)
Patient ID: Adrian Cohen, male   DOB: 1940-09-26, 74 y.o.   MRN: [id number]   Subjective:    Patient ID: Adrian Cohen, male    DOB: [birth year], 74 y.o.   MRN: [id number]  HPI  Patient here for his physical exam.  Shoulder is doing some better.  Saw ortho.  See their note for details.  Taking meloxicam.  Exercising.  Discussed side effects and risk of antiinflammatories.  No acid reflux.  Stays active.  No cardiac symptoms with increased activity or exertion.  Breathing stable.  Concern over increased breast tissues.  Has noticed for years.  Appears to be more recently.  States had mammogram.  Obtain results.     Past Medical History  Diagnosis Date  . Hypertension   . Hypercholesterolemia   . Peripheral vascular disease   . COPD (chronic obstructive pulmonary disease)   . Asthma   . Anemia   . S/P colonoscopy 08.29.00    Current Outpatient Prescriptions on File Prior to Visit  Medication Sig Dispense Refill  . amLODipine (NORVASC) 10 MG tablet TAKE 1 TABLET DAILY 90 tablet 0  . aspirin 81 MG tablet Take 81 mg by mouth daily.    . COMBIVENT RESPIMAT 20-100 MCG/ACT AERS respimat Take 1 puff by mouth every 6 (six) hours as needed.     Marland Kitchen esomeprazole (NEXIUM) 40 MG capsule TAKE 1 CAPSULE TWICE A DAY 180 capsule 2  . fluticasone-salmeterol (ADVAIR HFA) 45-21 MCG/ACT inhaler Inhale 2 puffs into the lungs 2 (two) times daily. Inhale 2 puffs as directed every twelve hours    . hydrochlorothiazide (HYDRODIURIL) 25 MG tablet TAKE 1 TABLET DAILY 90 tablet 1  . KLOR-CON M10 10 MEQ tablet TAKE 1 TABLET DAILY 90 tablet 0  . Multiple Vitamin (MULTIVITAMIN) capsule Take 1 capsule by mouth daily. Take 1 capsule by mouth once a day    . mupirocin cream (BACTROBAN) 2 % Apply 1 application topically 2 (two) times daily. 15 g 0  . NASONEX 50 MCG/ACT nasal spray Place 2 sprays into the nose daily.     . Olopatadine HCl (PATANASE) 0.6 % SOLN Place into the nose.    . quinapril (ACCUPRIL) 40 MG tablet  TAKE 1 TABLET DAILY 90 tablet 0  . tamsulosin (FLOMAX) 0.4 MG CAPS capsule     . vitamin E 400 UNIT capsule Take 400 Units by mouth daily. Take 1 by mouth once a day    . WELCHOL 625 MG tablet TAKE 6 TABLETS DAILY 540 tablet 1   No current facility-administered medications on file prior to visit.    Review of Systems  Constitutional: Negative for appetite change and unexpected weight change.  HENT: Negative for congestion, sinus pressure and sore throat.   Eyes: Negative for pain and visual disturbance.  Respiratory: Negative for cough, chest tightness and shortness of breath.   Cardiovascular: Negative for chest pain, palpitations and leg swelling.  Gastrointestinal: Negative for nausea, vomiting, abdominal pain and diarrhea.  Genitourinary: Negative for dysuria and difficulty urinating.  Musculoskeletal: Negative for back pain.       Right shoulder pain - better.   Skin: Negative for color change and rash.  Neurological: Negative for dizziness, light-headedness and headaches.  Hematological: Negative for adenopathy. Does not bruise/bleed easily.  Psychiatric/Behavioral: Negative for dysphoric mood and agitation.       Objective:    Physical Exam  Constitutional: He is oriented to person, place, and time. He appears well-developed and well-nourished. No distress.  HENT:  Head: Normocephalic and atraumatic.  Nose: Nose normal.  Mouth/Throat: Oropharynx is clear and moist. No oropharyngeal exudate.  Eyes: Conjunctivae are normal. Right eye exhibits no discharge. Left eye exhibits no discharge.  Neck: Neck supple. No thyromegaly present.  Cardiovascular: Normal rate and regular rhythm.   Pulmonary/Chest: Breath sounds normal. No respiratory distress. He has no wheezes.  Diffuse breast tissue - bilaterally.  No distinct nodules or axillary adenopathy.    Abdominal: Soft. Bowel sounds are normal. There is no tenderness.  Musculoskeletal: He exhibits no edema or tenderness.    Lymphadenopathy:    He has no cervical adenopathy.  Neurological: He is alert and oriented to person, place, and time.  Skin: Skin is warm and dry. No rash noted.  Psychiatric: He has a normal mood and affect. His behavior is normal.    BP 118/98 mmHg  Pulse 91  Temp(Src) 98.3 F (36.8 C) (Oral)  Ht 5' 10.5" (1.791 m)  Wt 178 lb 2 oz (80.797 kg)  BMI 25.19 kg/m2  SpO2 93% Wt Readings from Last 3 Encounters:  05/09/14 178 lb 2 oz (80.797 kg)  02/21/14 181 lb 8 oz (82.328 kg)  10/22/13 168 lb 8 oz (76.431 kg)     Lab Results  Component Value Date   WBC 2.9* 04/23/2014   HGB 13.8 04/23/2014   HCT 40.6 04/23/2014   PLT 266.0 04/23/2014   GLUCOSE 90 04/23/2014   CHOL 204* 04/23/2014   TRIG 92.0 04/23/2014   HDL 68.60 04/23/2014   LDLDIRECT 130.7 02/21/2013   LDLCALC 117* 04/23/2014   ALT 11 04/23/2014   AST 20 04/23/2014   NA 134* 04/23/2014   K 4.0 04/23/2014   CL 97 04/23/2014   CREATININE 0.88 04/23/2014   BUN 7 04/23/2014   CO2 31 04/23/2014   TSH 1.00 04/23/2014       Assessment & Plan:   Problem List Items Addressed This Visit    Bilateral shoulder pain    Just saw ortho.  Right shoulder.  Was placed on meloxicam.  Exercising.  Is better.  See above regarding antiinflammatories.        Gastritis    EGD 07/11/12 - gastritis.  Currently asymptomatic.  Careful with antiinflammatories.  Would like to avoid.  On nexium.       Gynecomastia - Primary    Diffuse soft breast tissues.  No nodules.  Check prolactin, estradiol and testosterone levels.  Further w/up pending.       Relevant Orders   Estradiol (Completed)   Testosterone (Completed)   Prolactin (Completed)   Health care maintenance    Physical 05/09/14.  Sees Dr Jacqlyn Larsen for his prostate.  Colonoscopy 2014 as outlined.        History of colon polyps    Colonoscopy 07/11/12 as outlined.  Recommend f/u colonoscopy 07/2015.       Hypercholesterolemia    On welchol.  Low cholesterol diet.  Last  improved more.  Follow.   Lab Results  Component Value Date   CHOL 204* 04/23/2014   HDL 68.60 04/23/2014   LDLCALC 117* 04/23/2014   LDLDIRECT 130.7 02/21/2013   TRIG 92.0 04/23/2014   CHOLHDL 3 04/23/2014        Hypertension    Blood pressure has been doing well.  Initially taken with automated cuff.  Recheck blood pressure 132/84.  Continue same medication regimen.  Follow pressures.  Follow metabolic panel.        Leukopenia    Cbc  just checked - stable.        Peripheral vascular disease    Exercising.  No pain.  Continue daily aspirin and risk factor modification.        Other Visit Diagnoses    Need for prophylactic vaccination against Streptococcus pneumoniae (pneumococcus)        Relevant Orders    Pneumococcal conjugate vaccine 13-valent (Completed)        Einar Pheasant, MD

## 2014-05-13 NOTE — Assessment & Plan Note (Signed)
Physical 05/09/14.  Sees Dr Jacqlyn Larsen for his prostate.  Colonoscopy 2014 as outlined.

## 2014-05-13 NOTE — Assessment & Plan Note (Signed)
Cbc just checked - stable.

## 2014-05-13 NOTE — Assessment & Plan Note (Signed)
Just saw ortho.  Right shoulder.  Was placed on meloxicam.  Exercising.  Is better.  See above regarding antiinflammatories.

## 2014-05-13 NOTE — Assessment & Plan Note (Signed)
Blood pressure has been doing well.  Initially taken with automated cuff.  Recheck blood pressure 132/84.  Continue same medication regimen.  Follow pressures.  Follow metabolic panel.

## 2014-05-13 NOTE — Assessment & Plan Note (Signed)
Exercising.  No pain.  Continue daily aspirin and risk factor modification.

## 2014-05-13 NOTE — Assessment & Plan Note (Signed)
Diffuse soft breast tissues.  No nodules.  Check prolactin, estradiol and testosterone levels.  Further w/up pending.

## 2014-05-13 NOTE — Assessment & Plan Note (Signed)
EGD 07/11/12 - gastritis.  Currently asymptomatic.  Careful with antiinflammatories.  Would like to avoid.  On nexium.

## 2014-05-13 NOTE — Assessment & Plan Note (Signed)
On welchol.  Low cholesterol diet.  Last improved more.  Follow.   Lab Results  Component Value Date   CHOL 204* 04/23/2014   HDL 68.60 04/23/2014   LDLCALC 117* 04/23/2014   LDLDIRECT 130.7 02/21/2013   TRIG 92.0 04/23/2014   CHOLHDL 3 04/23/2014

## 2014-05-14 ENCOUNTER — Encounter: Payer: Self-pay | Admitting: Internal Medicine

## 2014-05-14 LAB — PROLACTIN: Prolactin: 6 ng/mL (ref 2.1–17.1)

## 2014-05-14 LAB — TESTOSTERONE, FREE, TOTAL, SHBG
SEX HORMONE BINDING: 48 nmol/L (ref 22–77)
TESTOSTERONE: 320 ng/dL (ref 300–890)
Testosterone, Free: 49.7 pg/mL (ref 47.0–244.0)
Testosterone-% Free: 1.6 % (ref 1.6–2.9)

## 2014-05-15 LAB — ESTRADIOL, FREE
ESTRADIOL FREE: 0.46 pg/mL — AB (ref <=0.45)
Estradiol: 23 pg/mL (ref <=29)

## 2014-06-10 ENCOUNTER — Encounter
Admission: RE | Admit: 2014-06-10 | Discharge: 2014-06-10 | Disposition: A | Discharge status: Home / Self Care | Payer: Medicare Other | Source: Ambulatory Visit | Attending: Surgery | Admitting: Surgery

## 2014-06-10 ENCOUNTER — Other Ambulatory Visit: Payer: Self-pay | Admitting: Internal Medicine

## 2014-06-10 DIAGNOSIS — E780 Pure hypercholesterolemia: Secondary | ICD-10-CM | POA: Diagnosis not present

## 2014-06-10 DIAGNOSIS — Z01812 Encounter for preprocedural laboratory examination: Secondary | ICD-10-CM | POA: Insufficient documentation

## 2014-06-10 DIAGNOSIS — Z01810 Encounter for preprocedural cardiovascular examination: Secondary | ICD-10-CM | POA: Insufficient documentation

## 2014-06-10 DIAGNOSIS — D649 Anemia, unspecified: Secondary | ICD-10-CM | POA: Insufficient documentation

## 2014-06-10 DIAGNOSIS — N62 Hypertrophy of breast: Secondary | ICD-10-CM | POA: Insufficient documentation

## 2014-06-10 DIAGNOSIS — I739 Peripheral vascular disease, unspecified: Secondary | ICD-10-CM | POA: Insufficient documentation

## 2014-06-10 DIAGNOSIS — J449 Chronic obstructive pulmonary disease, unspecified: Secondary | ICD-10-CM | POA: Diagnosis not present

## 2014-06-10 DIAGNOSIS — K2970 Gastritis, unspecified, without bleeding: Secondary | ICD-10-CM | POA: Diagnosis not present

## 2014-06-10 DIAGNOSIS — I10 Essential (primary) hypertension: Secondary | ICD-10-CM | POA: Insufficient documentation

## 2014-06-10 DIAGNOSIS — J45909 Unspecified asthma, uncomplicated: Secondary | ICD-10-CM | POA: Insufficient documentation

## 2014-06-10 LAB — CBC
HCT: 40.4 % (ref 40.0–52.0)
Hemoglobin: 13.4 g/dL (ref 13.0–18.0)
MCH: 29.4 pg (ref 26.0–34.0)
MCHC: 33.2 g/dL (ref 32.0–36.0)
MCV: 88.6 fL (ref 80.0–100.0)
PLATELETS: 230 10*3/uL (ref 150–440)
RBC: 4.56 MIL/uL (ref 4.40–5.90)
RDW: 12.8 % (ref 11.5–14.5)
WBC: 2.6 10*3/uL — ABNORMAL LOW (ref 3.8–10.6)

## 2014-06-10 LAB — BASIC METABOLIC PANEL
ANION GAP: 8 (ref 5–15)
BUN: 7 mg/dL (ref 6–20)
CHLORIDE: 95 mmol/L — AB (ref 101–111)
CO2: 30 mmol/L (ref 22–32)
Calcium: 9.2 mg/dL (ref 8.9–10.3)
Creatinine, Ser: 0.83 mg/dL (ref 0.61–1.24)
GFR calc Af Amer: >60 mL/min (ref >60)
GFR calc non Af Amer: >60 mL/min (ref >60)
Glucose, Bld: 86 mg/dL (ref 65–99)
Potassium: 4.3 mmol/L (ref 3.5–5.1)
SODIUM: 133 mmol/L — AB (ref 135–145)

## 2014-06-10 NOTE — Patient Instructions (Addendum)
  Your procedure is scheduled on: '@ADMITDT2'$ @ Report to Day Surgery. To find out your arrival time please call [phone] between 1PM - 3PM on Friday 06/14/2014  Remember: Instructions that are not followed completely may result in serious medical risk, up to and including death, or upon the discretion of your surgeon and anesthesiologist your surgery may need to be rescheduled.    __x__ 1. Do not eat food or drink liquids after midnight. No gum chewing or hard candies.     __x__ 2. No Alcohol for 24 hours before or after surgery.   ____ 3. Bring all medications with you on the day of surgery if instructed.    __x__ 4. Notify your doctor if there is any change in your medical condition     (cold, fever, infections).     Do not wear jewelry, make-up, hairpins, clips or nail polish.  Do not wear lotions, powders, or perfumes. You may wear deodorant.  Do not shave 48 hours prior to surgery. Men may shave face and neck.  Do not bring valuables to the hospital.    [facility] is not responsible for any belongings or valuables.               Contacts, dentures or bridgework may not be worn into surgery.  Leave your suitcase in the car. After surgery it may be brought to your room.  For patients admitted to the hospital, discharge time is determined by your                treatment team.   Patients discharged the day of surgery will not be allowed to drive home.   Please read over the following fact sheets that you were given:   Surgical Site Infection Prevention   __X__ Take these medicines the morning of surgery with A SIP OF WATER:    1. amlodipine  2. Esomeprazole   3. Quinapril  4. Combivent inhaler and bring to hospital  5. Advair inhaler  6. Welchol    ____ [employer] (as directed)   __X_ Use CHG Soap as directed  __X_ Use inhalers on the day of surgery  ____ Stop metformin 2 days prior to surgery    ____ Take 1/2 of usual insulin dose the night before surgery and  none on the morning of surgery.   ____ Stop Coumadin/Plavix/aspirin on as directed by your doctor  __X_ Stop Anti-inflammatories on today   __X__ Stop supplements until after surgery. Vitamin E ____ Bring C-Pap to the hospital.

## 2014-06-12 ENCOUNTER — Other Ambulatory Visit: Payer: Self-pay | Admitting: *Deleted

## 2014-06-12 MED ORDER — ESOMEPRAZOLE MAGNESIUM 40 MG PO CPDR
DELAYED_RELEASE_CAPSULE | ORAL | Status: DC
Start: 2014-06-12 — End: 2014-08-06

## 2014-06-12 NOTE — Progress Notes (Signed)
I am not sure what I am supposed to do with this message.  I do not see a request or medication, etc.  Thanks

## 2014-06-14 MED ORDER — CEFAZOLIN SODIUM 1-5 GM-% IV SOLN: 1.0000 g | Freq: Once | INTRAVENOUS | Status: DC

## 2014-06-17 ENCOUNTER — Ambulatory Visit: Payer: Medicare Other | Admitting: Anesthesiology

## 2014-06-17 ENCOUNTER — Encounter: Payer: Self-pay | Admitting: Anesthesiology

## 2014-06-17 ENCOUNTER — Ambulatory Visit
Admission: RE | Admit: 2014-06-17 | Discharge: 2014-06-17 | Disposition: A | Discharge status: Home / Self Care | Payer: Medicare Other | Source: Ambulatory Visit | Attending: Surgery | Admitting: Surgery

## 2014-06-17 ENCOUNTER — Encounter
Admission: RE | Disposition: A | Discharge status: Home / Self Care | Payer: Self-pay | Source: Ambulatory Visit | Attending: Surgery

## 2014-06-17 DIAGNOSIS — N62 Hypertrophy of breast: Secondary | ICD-10-CM | POA: Insufficient documentation

## 2014-06-17 DIAGNOSIS — Z809 Family history of malignant neoplasm, unspecified: Secondary | ICD-10-CM | POA: Insufficient documentation

## 2014-06-17 DIAGNOSIS — K219 Gastro-esophageal reflux disease without esophagitis: Secondary | ICD-10-CM | POA: Diagnosis not present

## 2014-06-17 DIAGNOSIS — R351 Nocturia: Secondary | ICD-10-CM | POA: Insufficient documentation

## 2014-06-17 DIAGNOSIS — Z8249 Family history of ischemic heart disease and other diseases of the circulatory system: Secondary | ICD-10-CM | POA: Insufficient documentation

## 2014-06-17 DIAGNOSIS — N400 Enlarged prostate without lower urinary tract symptoms: Secondary | ICD-10-CM | POA: Diagnosis not present

## 2014-06-17 DIAGNOSIS — I10 Essential (primary) hypertension: Secondary | ICD-10-CM | POA: Insufficient documentation

## 2014-06-17 DIAGNOSIS — R972 Elevated prostate specific antigen [PSA]: Secondary | ICD-10-CM | POA: Diagnosis not present

## 2014-06-17 DIAGNOSIS — Z79899 Other long term (current) drug therapy: Secondary | ICD-10-CM | POA: Diagnosis not present

## 2014-06-17 HISTORY — PX: GYNECOMASTIA MASTECTOMY: SHX5265

## 2014-06-17 SURGERY — MASTECTOMY, FOR GYNECOMASTIA
Anesthesia: General | Laterality: Bilateral

## 2014-06-17 MED ORDER — GLYCOPYRROLATE 0.2 MG/ML IJ SOLN
INTRAMUSCULAR | Status: DC | PRN
  Administered 2014-06-17: 0.2 mg via INTRAVENOUS

## 2014-06-17 MED ORDER — ONDANSETRON HCL 4 MG/2ML IJ SOLN
4.0000 mg | Freq: Once | INTRAMUSCULAR | Status: DC | PRN
Start: 2014-06-17 — End: 2014-06-17

## 2014-06-17 MED ORDER — HYDROCODONE-ACETAMINOPHEN 5-325 MG PO TABS: 1.0000 | ORAL_TABLET | Freq: Four times a day (QID) | ORAL | Status: DC | PRN

## 2014-06-17 MED ORDER — ACETAMINOPHEN 10 MG/ML IV SOLN
INTRAVENOUS | Status: AC
  Filled 2014-06-17: qty 100

## 2014-06-17 MED ORDER — HEPARIN SODIUM (PORCINE) 5000 UNIT/ML IJ SOLN
INTRAMUSCULAR | Status: AC
  Filled 2014-06-17: qty 1

## 2014-06-17 MED ORDER — LACTATED RINGERS IV SOLN
INTRAVENOUS | Status: DC
  Administered 2014-06-17 (×2): via INTRAVENOUS

## 2014-06-17 MED ORDER — LIDOCAINE HCL (CARDIAC) 20 MG/ML IV SOLN
INTRAVENOUS | Status: DC | PRN
Start: 2014-06-17 — End: 2014-06-17
  Administered 2014-06-17: 100 mg via INTRAVENOUS

## 2014-06-17 MED ORDER — ONDANSETRON HCL 4 MG/2ML IJ SOLN
INTRAMUSCULAR | Status: DC | PRN
  Administered 2014-06-17: 4 mg via INTRAVENOUS

## 2014-06-17 MED ORDER — PROPOFOL 10 MG/ML IV BOLUS
INTRAVENOUS | Status: DC | PRN
  Administered 2014-06-17: 30 mg via INTRAVENOUS
  Administered 2014-06-17: 170 mg via INTRAVENOUS

## 2014-06-17 MED ORDER — PHENYLEPHRINE HCL 10 MG/ML IJ SOLN
INTRAMUSCULAR | Status: DC | PRN
  Administered 2014-06-17 (×2): 100 ug via INTRAVENOUS

## 2014-06-17 MED ORDER — KETOROLAC TROMETHAMINE 30 MG/ML IJ SOLN
INTRAMUSCULAR | Status: DC | PRN
  Administered 2014-06-17: 30 mg via INTRAVENOUS

## 2014-06-17 MED ORDER — FENTANYL CITRATE (PF) 100 MCG/2ML IJ SOLN
INTRAMUSCULAR | Status: DC | PRN
  Administered 2014-06-17 (×2): 50 ug via INTRAVENOUS

## 2014-06-17 MED ORDER — EPHEDRINE SULFATE 50 MG/ML IJ SOLN
INTRAMUSCULAR | Status: DC | PRN
  Administered 2014-06-17: 10 mg via INTRAVENOUS

## 2014-06-17 MED ORDER — CEFAZOLIN SODIUM-DEXTROSE 2-3 GM-% IV SOLR
2.0000 g | Freq: Once | INTRAVENOUS | Status: AC
  Administered 2014-06-17: 2 g via INTRAVENOUS

## 2014-06-17 MED ORDER — HEPARIN SODIUM (PORCINE) 5000 UNIT/ML IJ SOLN
5000.0000 [IU] | Freq: Once | INTRAMUSCULAR | Status: AC
  Administered 2014-06-17: 06:00:00 via SUBCUTANEOUS

## 2014-06-17 MED ORDER — ACETAMINOPHEN 10 MG/ML IV SOLN
INTRAVENOUS | Status: DC | PRN
  Administered 2014-06-17: 1000 mg via INTRAVENOUS

## 2014-06-17 MED ORDER — MIDAZOLAM HCL 2 MG/2ML IJ SOLN
INTRAMUSCULAR | Status: DC | PRN
  Administered 2014-06-17: 2 mg via INTRAVENOUS

## 2014-06-17 MED ORDER — CEFAZOLIN SODIUM-DEXTROSE 2-3 GM-% IV SOLR
INTRAVENOUS | Status: AC
  Administered 2014-06-17: 2 g via INTRAVENOUS
  Filled 2014-06-17: qty 50

## 2014-06-17 MED ORDER — FENTANYL CITRATE (PF) 100 MCG/2ML IJ SOLN: 25.0000 ug | INTRAMUSCULAR | Status: DC | PRN

## 2014-06-17 SURGICAL SUPPLY — 35 items
BAG COUNTER SPONGE EZ (MISCELLANEOUS) ×2 IMPLANT
BENZOIN TINCTURE PRP APPL 2/3 (GAUZE/BANDAGES/DRESSINGS) ×3 IMPLANT
CANISTER SUCT 1200ML W/VALVE (MISCELLANEOUS) ×3 IMPLANT
CLOSURE WOUND 1/4X4 (GAUZE/BANDAGES/DRESSINGS) ×1
COUNTER SPONGE BAG EZ (MISCELLANEOUS) ×1
DRAPE LAPAROTOMY 100X77 ABD (DRAPES) ×3 IMPLANT
ELECT CAUTERY NEEDLE TIP 1.0 (MISCELLANEOUS) ×3
ELECTRODE CAUTERY NEDL TIP 1.0 (MISCELLANEOUS) ×1 IMPLANT
GAUZE SPONGE 4X4 12PLY STRL (GAUZE/BANDAGES/DRESSINGS) ×3 IMPLANT
GLOVE BIO SURGEON STRL SZ7.5 (GLOVE) ×3 IMPLANT
GOWN STRL REUS W/ TWL LRG LVL3 (GOWN DISPOSABLE) ×2 IMPLANT
GOWN STRL REUS W/TWL LRG LVL3 (GOWN DISPOSABLE) ×4
LABEL OR SOLS (LABEL) ×3 IMPLANT
NDL SAFETY 25GX1.5 (NEEDLE) ×6 IMPLANT
NS IRRIG 500ML POUR BTL (IV SOLUTION) ×3 IMPLANT
PACK BASIN MINOR ARMC (MISCELLANEOUS) ×3 IMPLANT
PAD GROUND ADULT SPLIT (MISCELLANEOUS) ×3 IMPLANT
STRAP SAFETY BODY (MISCELLANEOUS) ×3 IMPLANT
STRIP CLOSURE SKIN 1/4X4 (GAUZE/BANDAGES/DRESSINGS) ×2 IMPLANT
SUT ETHILON 4-0 (SUTURE) ×8
SUT ETHILON 4-0 FS2 18XMFL BLK (SUTURE) ×4
SUT SILK 3-0 (SUTURE) ×8
SUT SILK 3-0 SH-1 18XCR BRD (SUTURE) ×4
SUT SILK 4 0 (SUTURE) ×2
SUT SILK 4-0 18XBRD TIE 12 (SUTURE) ×1 IMPLANT
SUT VIC AB 3-0 SH 27 (SUTURE) ×4
SUT VIC AB 3-0 SH 27X BRD (SUTURE) ×2 IMPLANT
SUT VIC AB 4-0 PS2 18 (SUTURE) ×6 IMPLANT
SUTURE ETHLN 4-0 FS2 18XMF BLK (SUTURE) ×4 IMPLANT
SUTURE SILK 3-0 SH-1 18XCR BRD (SUTURE) ×4 IMPLANT
SWABSTK COMLB BENZOIN TINCTURE (MISCELLANEOUS) ×3 IMPLANT
SYR BULB EAR ULCER 3OZ GRN STR (SYRINGE) IMPLANT
SYRINGE 10CC LL (SYRINGE) ×6 IMPLANT
TAPE SURG TRANSPORE 1 IN (GAUZE/BANDAGES/DRESSINGS) ×1 IMPLANT
TAPE SURGICAL TRANSPORE 1 IN (GAUZE/BANDAGES/DRESSINGS) ×2

## 2014-06-17 NOTE — Anesthesia Procedure Notes (Signed)
Procedure Name: LMA Insertion Performed by: Doreen Salvage Pre-anesthesia Checklist: Patient identified, Patient being monitored, Emergency Drugs available and Suction available Patient Re-evaluated:Patient Re-evaluated prior to inductionOxygen Delivery Method: Circle system utilized Preoxygenation: Pre-oxygenation with 100% oxygen Intubation Type: IV induction Ventilation: Mask ventilation without difficulty LMA: LMA inserted LMA Size: 4.5 Tube type: Oral Number of attempts: 1 Placement Confirmation: positive ETCO2 and breath sounds checked- equal and bilateral Tube secured with: Tape Dental Injury: Teeth and Oropharynx as per pre-operative assessment
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
## 2014-06-17 NOTE — Discharge Instructions (Signed)
General Anesthesia, Care After Refer to this sheet in the next few weeks. These instructions provide you with information on caring for yourself after your procedure. Your health care provider may also give you more specific instructions. Your treatment has been planned according to current medical practices, but problems sometimes occur. Call your health care provider if you have any problems or questions after your procedure. WHAT TO EXPECT AFTER THE PROCEDURE After the procedure, it is typical to experience:  Sleepiness.  Nausea and vomiting. HOME CARE INSTRUCTIONS  For the first 24 hours after general anesthesia:  Have a responsible person with you.  Do not drive a car. If you are alone, do not take public transportation.  Do not drink alcohol.  Do not take medicine that has not been prescribed by your health care provider.  Do not sign important papers or make important decisions.  You may resume a normal diet and activities as directed by your health care provider.  Change bandages (dressings) as directed.  If you have questions or problems that seem related to general anesthesia, call the hospital and ask for the anesthetist or anesthesiologist on call. SEEK MEDICAL CARE IF:  You have nausea and vomiting that continue the day after anesthesia.  You develop a rash. SEEK IMMEDIATE MEDICAL CARE IF:   You have difficulty breathing.  You have chest pain.  You have any allergic problems. Document Released: 04/26/2000 Document Revised: 01/23/2013 Document Reviewed: 08/03/2012 [facility] Patient Information 2015 [place], [place]. This information is not intended to replace advice given to you by your health care provider. Make sure you discuss any questions you have with your health care provider.  Inguinal Hernia, Adult  Care After  Refer to this sheet in the next few weeks. These discharge instructions provide you with general information on caring for yourself after you  leave the hospital. Your caregiver may also give you specific instructions. Your treatment has been planned according to the most current medical practices available, but unavoidable complications sometimes occur. If you have any problems or questions after discharge, please call your caregiver.  HOME CARE INSTRUCTIONS  Change bandages (dressings) as directed.  Keep the wound dry and clean. The wound may be washed gently with soap and water. Gently blot or dab the wound dry. It is okay to take showers 24 to 48 hours after surgery. Do not take baths, use swimming pools, or use hot tubs for 10 days, or as directed by your caregiver.  Only take over-the-counter or prescription medicines for pain, discomfort, or fever as directed by your caregiver.  Continue your normal diet as directed.  Do not lift anything more than 10 pounds or play contact sports for 3 weeks, or as directed. SEEK MEDICAL CARE IF:  There is redness, swelling, or increasing pain in the wound.  There is fluid (pus) coming from the wound.  There is drainage from a wound lasting longer than 1 day.  You have an oral temperature above 102 F (38.9 C).  You notice a bad smell coming from the wound or dressing.  The wound breaks open after the stitches (sutures) have been removed.  You notice increasing pain in the shoulders (shoulder strap areas).  You develop dizzy episodes or fainting while standing.  You feel sick to your stomach (nauseous) or throw up (vomit). SEEK IMMEDIATE MEDICAL CARE IF:  You have difficulty breathing.  You develop a reaction or have side effects to medicines you were given. MAKE SURE YOU:   Inguinal  Hernia, Adult  Care After  Refer to this sheet in the next few weeks. These discharge instructions provide you with general information on caring for yourself after you leave the hospital. Your caregiver may also give you specific instructions. Your treatment has been planned according to the most current  medical practices available, but unavoidable complications sometimes occur. If you have any problems or questions after discharge, please call your caregiver.  HOME CARE INSTRUCTIONS  Change bandages (dressings) as directed.  Keep the wound dry and clean. The wound may be washed gently with soap and water. Gently blot or dab the wound dry. It is okay to take showers 24 to 48 hours after surgery. Do not take baths, use swimming pools, or use hot tubs for 10 days, or as directed by your caregiver.  Only take over-the-counter or prescription medicines for pain, discomfort, or fever as directed by your caregiver.  Continue your normal diet as directed.  Do not lift anything more than 10 pounds or play contact sports for 3 weeks, or as directed. SEEK MEDICAL CARE IF:  There is redness, swelling, or increasing pain in the wound.  There is fluid (pus) coming from the wound.  There is drainage from a wound lasting longer than 1 day.  You have an oral temperature above 102 F (38.9 C).  You notice a bad smell coming from the wound or dressing.  The wound breaks open after the stitches (sutures) have been removed.  You notice increasing pain in the shoulders (shoulder strap areas).  You develop dizzy episodes or fainting while standing.  You feel sick to your stomach (nauseous) or throw up (vomit). SEEK IMMEDIATE MEDICAL CARE IF:  You have difficulty breathing.  You develop a reaction or have side effects to medicines you were given. MAKE SURE YOU:  Understand these instructions.  Will watch your condition.  Will get help right away if you are not doing well or get worse.  Understand these instructions.  Will watch your condition.  Will get help right away if you are not doing well or get worse. Inguinal Hernia, Adult  Care After  Refer to this sheet in the next few weeks. These discharge instructions provide you with general information on caring for yourself after you leave the hospital. Your  caregiver may also give you specific instructions. Your treatment has been planned according to the most current medical practices available, but unavoidable complications sometimes occur. If you have any problems or questions after discharge, please call your caregiver.  HOME CARE INSTRUCTIONS  Change bandages (dressings) as directed.  Keep the wound dry and clean. The wound may be washed gently with soap and water. Gently blot or dab the wound dry. It is okay to take showers 24 to 48 hours after surgery. Do not take baths, use swimming pools, or use hot tubs for 10 days, or as directed by your caregiver.  Only take over-the-counter or prescription medicines for pain, discomfort, or fever as directed by your caregiver.  Continue your normal diet as directed.  Do not lift anything more than 10 pounds or play contact sports for 3 weeks, or as directed. SEEK MEDICAL CARE IF:  There is redness, swelling, or increasing pain in the wound.  There is fluid (pus) coming from the wound.  There is drainage from a wound lasting longer than 1 day.  You have an oral temperature above 102 F (38.9 C).  You notice a bad smell coming from the wound or dressing.  The wound breaks open after the stitches (sutures) have been removed.  You notice increasing pain in the shoulders (shoulder strap areas).  You develop dizzy episodes or fainting while standing.  You feel sick to your stomach (nauseous) or throw up (vomit). SEEK IMMEDIATE MEDICAL CARE IF:  You have difficulty breathing.  You develop a reaction or have side effects to medicines you were given. MAKE SURE YOU:  Understand these instructions.  Will watch your condition.  Will get help right away if you are not doing well or get worse.

## 2014-06-17 NOTE — Transfer of Care (Signed)
Immediate Anesthesia Transfer of Care Note  Patient: Adrian Cohen  Procedure(s) Performed: Procedure(s): MASTECTOMY GYNECOMASTIA (Bilateral)  Patient Location: PACU  Anesthesia Type:General  Level of Consciousness: sedated  Airway & Oxygen Therapy: Patient Spontanous Breathing and Patient connected to face mask oxygen  Post-op Assessment: Report given to RN and Post -op Vital signs reviewed and stable  Post vital signs: Reviewed and stable  Last Vitals:  Filed Vitals:   06/17/14 0900  BP: 92/62  Pulse: 75  Temp: 35.8 C  Resp: 11    Complications: No apparent anesthesia complications

## 2014-06-17 NOTE — Op Note (Signed)
06/17/2014  9:03 AM  PATIENT:  Adrian Cohen  74 y.o. male  PRE-OPERATIVE DIAGNOSIS:  IDIOPATHIC GYNECOMASTIA  POST-OPERATIVE DIAGNOSIS:  Same  PROCEDURE:  Procedure(s): MASTECTOMY GYNECOMASTIA (Bilateral)  SURGEON:  Surgeon(s) and Role:    * Dia Crawford III, MD - Primary   ASSISTANTS: none   ANESTHESIA:   general  EBL:  Total I/O In: 900 [I.V.:900] Out: -    DRAINS: none   LOCAL MEDICATIONS USED:  NONE   DISPOSITION OF SPECIMEN:  PATHOLOGY   DICTATION: With the patient supine position and after the induction of appropriate general anesthesia, the patient's chest was prepped with ChloraPrep and draped sterile towels. The right side was addressed first. A semicircular incision was made around the right nipple and carried down to subcutaneous space tissue using Bovie electrocautery. The breast tissue was then dissected free in a circumferential fashion using the Bovie electrocautery. The specimen was taken down to the chest wall dissected laterally and medially and then inferiorly preserving the nipple. The specimen was then passed off the table prepared for pathology. The subcutaneous space was irrigated. Subcutaneous space was obliterated with 3-0 Vicryl sutures. The skin was closed with running subcuticular suture 4-0 Vicryl benzoin Steri-Strips applied.  Attention was then turned to the left side where a similar semicircular incision was made and carried down to the subcutaneous tissue using Bovie cautery. The breast tissue was identified and dissected free from the skin down to the chest wall. Again the dissection was carried laterally and medially under direct vision and then inferiorly separating the specimen from the nipple but preserving the nipple. This was then passed off the table for pathologic evaluation. Subcutaneous space was irrigated that obliterated with 3-0 Vicryl. The skin was again closed with running subcutaneous suture 4-0 Vicryl benzoin and Steri-Strips. A  compressive dressing was applied. The patient was then returned recovery room having tolerated procedure well. Sponge is for needle count correct 2 in the operating room.   PLAN OF CARE: Discharge to home after PACU  PATIENT DISPOSITION:  PACU - hemodynamically stable.   Dia Crawford, III, MD

## 2014-06-17 NOTE — Anesthesia Postprocedure Evaluation (Signed)
  Anesthesia Post-op Note  Patient: Adrian Cohen  Procedure(s) Performed: Procedure(s): MASTECTOMY GYNECOMASTIA (Bilateral)  Anesthesia type:General  Patient location: PACU  Post pain: Pain level controlled  Post assessment: Post-op Vital signs reviewed, Patient's Cardiovascular Status Stable, Respiratory Function Stable, Patent Airway and No signs of Nausea or vomiting  Post vital signs: Reviewed and stable  Last Vitals:  Filed Vitals:   06/17/14 0949  BP:   Pulse: 78  Temp: 36.4 C  Resp: 14    Level of consciousness: awake, alert  and patient cooperative  Complications: No apparent anesthesia complications

## 2014-06-17 NOTE — Progress Notes (Deleted)
Change dressing after 48 hours or as necessary  Do not bathe for 4-5 days.  Replace dressing as necessary.  Do not drive while taking pain medication.  Do not operate machinery or make decisions while taking pain medications.  Follow-up in the office as planned.

## 2014-06-17 NOTE — H&P (Signed)
No change in pre hospital exam or evaluation.  No new complaints.  Discussed plans for surgery with him again and he is in agreement.   Will proceed with surgery

## 2014-06-18 ENCOUNTER — Encounter: Payer: Self-pay | Admitting: Surgery

## 2014-06-18 LAB — SURGICAL PATHOLOGY

## 2014-07-10 ENCOUNTER — Ambulatory Visit (INDEPENDENT_AMBULATORY_CARE_PROVIDER_SITE_OTHER): Payer: Medicare Other | Admitting: Surgery

## 2014-07-10 ENCOUNTER — Encounter: Payer: Self-pay | Admitting: Surgery

## 2014-07-10 VITALS — BP 138/86 | HR 96 | Temp 98.1°F | Ht 71.0 in | Wt 180.2 lb

## 2014-07-10 DIAGNOSIS — T888XXA Other specified complications of surgical and medical care, not elsewhere classified, initial encounter: Secondary | ICD-10-CM

## 2014-07-10 DIAGNOSIS — T792XXA Traumatic secondary and recurrent hemorrhage and seroma, initial encounter: Secondary | ICD-10-CM | POA: Diagnosis not present

## 2014-07-10 DIAGNOSIS — N62 Hypertrophy of breast: Secondary | ICD-10-CM
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
## 2014-07-10 NOTE — Patient Instructions (Signed)
Follow-up as needed. Please call our office with any questions or concerns.

## 2014-07-24 ENCOUNTER — Telehealth: Payer: Self-pay | Admitting: Surgery

## 2014-07-24 NOTE — Telephone Encounter (Signed)
Patient would like to speak with the nurse - he was in the office a couple weeks ago and Dr Pat Patrick drew fluid off of his breast, patient thinks it may have returned. He would like to speak with a nurse to talk about his symptoms and discuss if he needs to come back in to be seen and have fluid removed again. Please call and advise.

## 2014-07-25 NOTE — Telephone Encounter (Signed)
Patient states that he still has fluid in breast tissue bilaterally even after aspiration. Does not see an increase in breast size but fluid is still present and patient is concerned. Denies pain, fever, or discharge. Patient placed on schedule for 08/06/14 in [place] with Dr. Burt Knack at 11:45. Pt read back appt info while on phone. Will call if he develops pain, fever, discharge, or redness.

## 2014-08-06 ENCOUNTER — Encounter: Payer: Self-pay | Admitting: Surgery

## 2014-08-06 ENCOUNTER — Ambulatory Visit (INDEPENDENT_AMBULATORY_CARE_PROVIDER_SITE_OTHER): Payer: Medicare Other | Admitting: Surgery

## 2014-08-06 VITALS — BP 128/79 | HR 94 | Temp 97.9°F | Resp 18

## 2014-08-06 DIAGNOSIS — T888XXD Other specified complications of surgical and medical care, not elsewhere classified, subsequent encounter: Secondary | ICD-10-CM

## 2014-08-06 DIAGNOSIS — T792XXD Traumatic secondary and recurrent hemorrhage and seroma, subsequent encounter: Secondary | ICD-10-CM

## 2014-08-06 DIAGNOSIS — IMO0001 Reserved for inherently not codable concepts without codable children: Secondary | ICD-10-CM

## 2014-08-06 NOTE — Progress Notes (Signed)
Outpatient postop visit  08/06/2014  Adrian Cohen is an 74 y.o. male.    Procedure: Bilateral subcutaneous mastectomies  CC: Right sided swelling  HPI: This patient status post bilateral subcutaneous mastectomies for benign disease. He has had a seroma drained on the right side once. He describes no pain no fevers chills but does have swelling in the right breast.  Medications reviewed.    Physical Exam:  BP 128/79 mmHg  Pulse 94  Temp(Src) 97.9 F (36.6 C) (Oral)  Resp 18    PE: Minimal erythema (A she states it is been like that even before his prior drainage procedure) nontender swelling of the right mastectomy site. Nipple is in place area and no drainage.    Assessment/Plan:  Mild seroma which recurred quickly after prior aspiration. No sign of obvious infection at this time. I personal belief is that this seroma in the postoperative period does not typically require drainage by Dr. Pat Patrick and I may differ in that opinion. With the patient's lack of symptoms at this time I would defer to Dr. Pat Patrick. I will have him see Dr. Pat Patrick and 2 weeks for reevaluation.  Florene Glen, MD, FACS

## 2014-08-26 ENCOUNTER — Ambulatory Visit (INDEPENDENT_AMBULATORY_CARE_PROVIDER_SITE_OTHER): Payer: Medicare Other | Admitting: Surgery

## 2014-08-26 ENCOUNTER — Encounter (INDEPENDENT_AMBULATORY_CARE_PROVIDER_SITE_OTHER): Payer: Self-pay

## 2014-08-26 VITALS — BP 145/76 | HR 84 | Temp 97.7°F | Ht 71.0 in | Wt 174.0 lb

## 2014-08-26 DIAGNOSIS — T792XXD Traumatic secondary and recurrent hemorrhage and seroma, subsequent encounter: Secondary | ICD-10-CM

## 2014-08-26 DIAGNOSIS — IMO0001 Reserved for inherently not codable concepts without codable children: Secondary | ICD-10-CM

## 2014-08-26 DIAGNOSIS — T888XXD Other specified complications of surgical and medical care, not elsewhere classified, subsequent encounter: Secondary | ICD-10-CM
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
## 2014-08-26 NOTE — Patient Instructions (Signed)
Patient to follow up in 1 month with Dr Pat Patrick

## 2014-09-09 ENCOUNTER — Ambulatory Visit: Payer: Medicare Other | Admitting: Internal Medicine

## 2014-09-11 ENCOUNTER — Ambulatory Visit (INDEPENDENT_AMBULATORY_CARE_PROVIDER_SITE_OTHER): Payer: Medicare Other | Admitting: Internal Medicine

## 2014-09-11 ENCOUNTER — Encounter: Payer: Self-pay | Admitting: Internal Medicine

## 2014-09-11 VITALS — BP 114/70 | HR 88 | Temp 98.1°F | Ht 71.0 in | Wt 171.1 lb

## 2014-09-11 DIAGNOSIS — I10 Essential (primary) hypertension: Secondary | ICD-10-CM

## 2014-09-11 DIAGNOSIS — K2970 Gastritis, unspecified, without bleeding: Secondary | ICD-10-CM

## 2014-09-11 DIAGNOSIS — D649 Anemia, unspecified: Secondary | ICD-10-CM | POA: Diagnosis not present

## 2014-09-11 DIAGNOSIS — E7800 Pure hypercholesterolemia, unspecified: Secondary | ICD-10-CM

## 2014-09-11 DIAGNOSIS — I739 Peripheral vascular disease, unspecified: Secondary | ICD-10-CM | POA: Diagnosis not present

## 2014-09-11 DIAGNOSIS — N62 Hypertrophy of breast: Secondary | ICD-10-CM

## 2014-09-11 DIAGNOSIS — Z86010 Personal history of colonic polyps: Secondary | ICD-10-CM

## 2014-09-11 DIAGNOSIS — D72819 Decreased white blood cell count, unspecified: Secondary | ICD-10-CM

## 2014-09-11 NOTE — Progress Notes (Signed)
Patient ID: Adrian Cohen, male   DOB: 21-Dec-1940, 74 y.o.   MRN: [id number]   Subjective:    Patient ID: Adrian Cohen, male    DOB: 1940/03/17, 74 y.o.   MRN: [id number]  HPI  Patient here for a scheduled follow up.  Is s/p bilateral subcutaneous mastectomies.  Left breast doing well.  Right breast - seroma.  Seeing Dr Pat Patrick.  Planning for f/u 10/21/14.  Persistent firm area.  Has been using heat.  Stays active.  No cardiac symptoms with increased activity or exertion.  No sob.  Breathing dong well.  No cough or congestion.  No acid reflux.  Controlled.  Eating and drinking well.  Bowels stable.  No blood.  Urinating ok.  Just evaluated by Dr Jacqlyn Larsen.  He checked psa.  States ok.     Past Medical History  Diagnosis Date  . Hypertension   . Hypercholesterolemia   . Peripheral vascular disease   . COPD (chronic obstructive pulmonary disease)   . Asthma   . Anemia   . S/P colonoscopy 08.29.00  . BPH (benign prostatic hyperplasia)   . Impotence   . Nocturia   . Nodular prostate with urinary obstruction   . Gastroesophageal reflux disease   . Gynecomastia   . Incomplete bladder emptying   . Elevated PSA   . Bladder outlet obstruction     Family history and social history reviewed.     Outpatient Encounter Prescriptions as of 09/11/2014  Medication Sig  . amLODipine (NORVASC) 10 MG tablet TAKE 1 TABLET DAILY  . aspirin 81 MG tablet Take 81 mg by mouth daily.  Marland Kitchen desonide (DESOWEN) 0.05 % cream   . esomeprazole (NEXIUM) 40 MG capsule TAKE 1 CAPSULE TWICE A DAY  . fluticasone-salmeterol (ADVAIR HFA) 45-21 MCG/ACT inhaler Inhale 2 puffs into the lungs 2 (two) times daily. Inhale 2 puffs as directed every twelve hours  . hydrochlorothiazide (HYDRODIURIL) 25 MG tablet TAKE 1 TABLET DAILY  . HYDROcodone-acetaminophen (NORCO/VICODIN) 5-325 MG per tablet   . Ipratropium-Albuterol (COMBIVENT RESPIMAT) 20-100 MCG/ACT AERS respimat   . mometasone (NASONEX) 50 MCG/ACT nasal spray Place into the  nose.  . Multiple Vitamin (MULTIVITAMIN) capsule Take 1 capsule by mouth daily. Take 1 capsule by mouth once a day  . mupirocin cream (BACTROBAN) 2 % Apply 1 application topically 2 (two) times daily.  . Olopatadine HCl 0.6 % SOLN Place into the nose.  . potassium chloride (K-DUR,KLOR-CON) 10 MEQ tablet TAKE 1 TABLET DAILY  . quinapril (ACCUPRIL) 40 MG tablet TAKE 1 TABLET DAILY  . tamsulosin (FLOMAX) 0.4 MG CAPS capsule   . Vitamin E 400 UNITS TABS Take by mouth.  Earnestine Mealing 625 MG tablet TAKE 6 TABLETS DAILY   No facility-administered encounter medications on file as of 09/11/2014.    Review of Systems  Constitutional: Negative for appetite change and unexpected weight change.  HENT: Negative for congestion and sinus pressure.   Eyes: Negative for discharge and redness.  Respiratory: Negative for cough, chest tightness and shortness of breath.   Cardiovascular: Negative for chest pain, palpitations and leg swelling.  Gastrointestinal: Negative for nausea, vomiting, abdominal pain and diarrhea.  Genitourinary: Negative for dysuria and difficulty urinating.  Musculoskeletal: Negative for back pain and joint swelling.  Skin: Negative for color change and rash.  Neurological: Negative for dizziness, light-headedness and headaches.  Hematological: Negative for adenopathy. Does not bruise/bleed easily.  Psychiatric/Behavioral: Negative for dysphoric mood and agitation.       Objective:  Blood pressure rechecked by me:  128/74  Physical Exam  Constitutional: He appears well-developed and well-nourished. No distress.  HENT:  Nose: Nose normal.  Mouth/Throat: Oropharynx is clear and moist.  Eyes: Conjunctivae are normal. Right eye exhibits no discharge. Left eye exhibits no discharge.  Neck: Neck supple. No thyromegaly present.  Cardiovascular: Normal rate and regular rhythm.   Pulmonary/Chest: Effort normal and breath sounds normal. No respiratory distress.  Abdominal: Soft.  Bowel sounds are normal. There is no tenderness.  Musculoskeletal: He exhibits no edema or tenderness.  Lymphadenopathy:    He has no cervical adenopathy.  Skin: No rash noted. No erythema.  Psychiatric: He has a normal mood and affect. His behavior is normal.    BP 114/70 mmHg  Pulse 88  Temp(Src) 98.1 F (36.7 C) (Oral)  Ht '5\' 11"'$  (1.803 m)  Wt 171 lb 1.6 oz (77.61 kg)  BMI 23.87 kg/m2  SpO2 95% Wt Readings from Last 3 Encounters:  09/11/14 171 lb 1.6 oz (77.61 kg)  08/26/14 174 lb (78.926 kg)  07/10/14 180 lb 3.2 oz (81.738 kg)     Lab Results  Component Value Date   WBC 2.6* 06/10/2014   HGB 13.4 06/10/2014   HCT 40.4 06/10/2014   PLT 230 06/10/2014   GLUCOSE 86 06/10/2014   CHOL 204* 04/23/2014   TRIG 92.0 04/23/2014   HDL 68.60 04/23/2014   LDLDIRECT 130.7 02/21/2013   LDLCALC 117* 04/23/2014   ALT 11 04/23/2014   AST 20 04/23/2014   NA 133* 06/10/2014   K 4.3 06/10/2014   CL 95* 06/10/2014   CREATININE 0.83 06/10/2014   BUN 7 06/10/2014   CO2 30 06/10/2014   TSH 1.00 04/23/2014       Assessment & Plan:   Problem List Items Addressed This Visit    Anemia    hgb has been stable.  Has had GI w/up.  Follow cbc.       Relevant Orders   CBC with Differential/Platelet   Gastritis    EGD 07/11/12 - gastritis.  Doing well on nexium.  Follow.        Gynecomastia    S/p bilateral mastectomies.  Seeing Dr Pat Patrick.  Has seroma.  Keep f/u appt.        History of colon polyps    Colonoscopy 07/11/12 - tubular adenoma.  Recommended f/u colonoscopy 07/2015.        Hypercholesterolemia    On welchol.  Low cholesterol diet and exercise.  Follow lipid profile.        Relevant Orders   Hepatic function panel   Lipid panel   Hypertension - Primary    Blood pressure under good control.  Continue same medication regimen.  Follow pressures.  Follow metabolic panel.        Relevant Orders   Basic metabolic panel   Leukopenia    Last check - stable.  Follow cbc.        Peripheral vascular disease    Exercising.  No pain.  Continue daily aspirin.  Continue risk factor modification.            Einar Pheasant, MD

## 2014-09-11 NOTE — Progress Notes (Signed)
Pre visit review using our clinic review tool, if applicable. No additional management support is needed unless otherwise documented below in the visit note. 

## 2014-09-14 ENCOUNTER — Encounter: Payer: Self-pay | Admitting: Internal Medicine

## 2014-09-14 NOTE — Assessment & Plan Note (Signed)
EGD 07/11/12 - gastritis.  Doing well on nexium.  Follow.

## 2014-09-14 NOTE — Assessment & Plan Note (Signed)
Last check - stable.  Follow cbc.

## 2014-09-14 NOTE — Assessment & Plan Note (Signed)
Exercising.  No pain.  Continue daily aspirin.  Continue risk factor modification.

## 2014-09-14 NOTE — Assessment & Plan Note (Signed)
On welchol.  Low cholesterol diet and exercise.  Follow lipid profile.

## 2014-09-14 NOTE — Assessment & Plan Note (Signed)
Blood pressure under good control.  Continue same medication regimen.  Follow pressures.  Follow metabolic panel.   

## 2014-09-14 NOTE — Assessment & Plan Note (Signed)
S/p bilateral mastectomies.  Seeing Dr Pat Patrick.  Has seroma.  Keep f/u appt.

## 2014-09-14 NOTE — Assessment & Plan Note (Signed)
hgb has been stable.  Has had GI w/up.  Follow cbc.

## 2014-09-14 NOTE — Assessment & Plan Note (Signed)
Colonoscopy 07/11/12 - tubular adenoma.  Recommended f/u colonoscopy 07/2015.

## 2014-09-20 ENCOUNTER — Ambulatory Visit (INDEPENDENT_AMBULATORY_CARE_PROVIDER_SITE_OTHER): Payer: Medicare Other | Admitting: Surgery

## 2014-09-20 ENCOUNTER — Encounter: Payer: Self-pay | Admitting: Surgery

## 2014-09-20 ENCOUNTER — Ambulatory Visit: Payer: Medicare Other | Admitting: Surgery

## 2014-09-20 VITALS — BP 135/68 | HR 94 | Temp 97.5°F | Ht 71.0 in | Wt 174.6 lb

## 2014-09-20 DIAGNOSIS — R972 Elevated prostate specific antigen [PSA]: Secondary | ICD-10-CM | POA: Insufficient documentation

## 2014-09-20 DIAGNOSIS — N62 Hypertrophy of breast: Secondary | ICD-10-CM

## 2014-09-20 DIAGNOSIS — N400 Enlarged prostate without lower urinary tract symptoms: Secondary | ICD-10-CM | POA: Insufficient documentation

## 2014-09-20 DIAGNOSIS — J449 Chronic obstructive pulmonary disease, unspecified: Secondary | ICD-10-CM | POA: Insufficient documentation

## 2014-09-20 DIAGNOSIS — N320 Bladder-neck obstruction: Secondary | ICD-10-CM | POA: Insufficient documentation

## 2014-09-20 DIAGNOSIS — K219 Gastro-esophageal reflux disease without esophagitis: Secondary | ICD-10-CM | POA: Insufficient documentation
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
## 2014-10-08 ENCOUNTER — Other Ambulatory Visit: Payer: Self-pay | Admitting: Internal Medicine

## 2014-10-14 ENCOUNTER — Other Ambulatory Visit (INDEPENDENT_AMBULATORY_CARE_PROVIDER_SITE_OTHER): Payer: Medicare Other

## 2014-10-14 DIAGNOSIS — I10 Essential (primary) hypertension: Secondary | ICD-10-CM | POA: Diagnosis not present

## 2014-10-14 DIAGNOSIS — E7800 Pure hypercholesterolemia, unspecified: Secondary | ICD-10-CM

## 2014-10-14 DIAGNOSIS — D649 Anemia, unspecified: Secondary | ICD-10-CM | POA: Diagnosis not present

## 2014-10-14 LAB — CBC WITH DIFFERENTIAL/PLATELET
BASOS PCT: 0.7 % (ref 0.0–3.0)
Basophils Absolute: 0 10*3/uL (ref 0.0–0.1)
Eosinophils Absolute: 0.1 10*3/uL (ref 0.0–0.7)
Eosinophils Relative: 1.9 % (ref 0.0–5.0)
HEMATOCRIT: 41.5 % (ref 39.0–52.0)
HEMOGLOBIN: 14 g/dL (ref 13.0–17.0)
LYMPHS PCT: 41.5 % (ref 12.0–46.0)
Lymphs Abs: 1.2 10*3/uL (ref 0.7–4.0)
MCHC: 33.6 g/dL (ref 30.0–36.0)
MCV: 87.7 fl (ref 78.0–100.0)
MONOS PCT: 16.3 % — AB (ref 3.0–12.0)
Monocytes Absolute: 0.5 10*3/uL (ref 0.1–1.0)
NEUTROS ABS: 1.2 10*3/uL — AB (ref 1.4–7.7)
Neutrophils Relative %: 39.6 % — ABNORMAL LOW (ref 43.0–77.0)
PLATELETS: 263 10*3/uL (ref 150.0–400.0)
RBC: 4.74 Mil/uL (ref 4.22–5.81)
RDW: 13.6 % (ref 11.5–15.5)
WBC: 3 10*3/uL — ABNORMAL LOW (ref 4.0–10.5)

## 2014-10-14 LAB — HEPATIC FUNCTION PANEL
ALBUMIN: 4.4 g/dL (ref 3.5–5.2)
ALT: 11 U/L (ref 0–53)
AST: 20 U/L (ref 0–37)
Alkaline Phosphatase: 55 U/L (ref 39–117)
Bilirubin, Direct: 0.1 mg/dL (ref 0.0–0.3)
TOTAL PROTEIN: 7.3 g/dL (ref 6.0–8.3)
Total Bilirubin: 0.5 mg/dL (ref 0.2–1.2)

## 2014-10-14 LAB — BASIC METABOLIC PANEL
BUN: 7 mg/dL (ref 6–23)
CHLORIDE: 95 meq/L — AB (ref 96–112)
CO2: 29 mEq/L (ref 19–32)
CREATININE: 0.84 mg/dL (ref 0.40–1.50)
Calcium: 9.5 mg/dL (ref 8.4–10.5)
GFR: 115.03 mL/min (ref >60.00)
GLUCOSE: 87 mg/dL (ref 70–99)
Potassium: 4.2 mEq/L (ref 3.5–5.1)
Sodium: 133 mEq/L — ABNORMAL LOW (ref 135–145)

## 2014-10-14 LAB — LIPID PANEL
CHOLESTEROL: 202 mg/dL — AB (ref 0–200)
HDL: 71.9 mg/dL (ref >39.00)
LDL Cholesterol: 113 mg/dL — ABNORMAL HIGH (ref 0–99)
NonHDL: 129.61
TRIGLYCERIDES: 83 mg/dL (ref 0.0–149.0)
Total CHOL/HDL Ratio: 3
VLDL: 16.6 mg/dL (ref 0.0–40.0)

## 2014-10-15 ENCOUNTER — Encounter: Payer: Self-pay | Admitting: *Deleted

## 2014-11-08 ENCOUNTER — Other Ambulatory Visit: Payer: Self-pay | Admitting: Internal Medicine

## 2014-11-27 ENCOUNTER — Ambulatory Visit (INDEPENDENT_AMBULATORY_CARE_PROVIDER_SITE_OTHER): Payer: Medicare Other | Admitting: Family Medicine

## 2014-11-27 ENCOUNTER — Encounter: Payer: Self-pay | Admitting: Family Medicine

## 2014-11-27 ENCOUNTER — Ambulatory Visit (INDEPENDENT_AMBULATORY_CARE_PROVIDER_SITE_OTHER)
Admission: RE | Admit: 2014-11-27 | Discharge: 2014-11-27 | Disposition: A | Discharge status: Home / Self Care | Payer: Medicare Other | Source: Ambulatory Visit | Attending: Family Medicine | Admitting: Family Medicine

## 2014-11-27 VITALS — BP 104/58 | HR 105 | Temp 97.6°F | Ht 71.0 in | Wt 176.1 lb

## 2014-11-27 DIAGNOSIS — R05 Cough: Secondary | ICD-10-CM | POA: Diagnosis not present

## 2014-11-27 DIAGNOSIS — R5383 Other fatigue: Secondary | ICD-10-CM | POA: Diagnosis not present

## 2014-11-27 DIAGNOSIS — R059 Cough, unspecified: Secondary | ICD-10-CM

## 2014-11-27 DIAGNOSIS — J189 Pneumonia, unspecified organism: Secondary | ICD-10-CM

## 2014-11-27 LAB — COMPREHENSIVE METABOLIC PANEL
ALK PHOS: 55 U/L (ref 39–117)
ALT: 9 U/L (ref 0–53)
AST: 16 U/L (ref 0–37)
Albumin: 4.1 g/dL (ref 3.5–5.2)
BILIRUBIN TOTAL: 0.5 mg/dL (ref 0.2–1.2)
BUN: 7 mg/dL (ref 6–23)
CALCIUM: 9.4 mg/dL (ref 8.4–10.5)
CO2: 30 meq/L (ref 19–32)
Chloride: 93 mEq/L — ABNORMAL LOW (ref 96–112)
Creatinine, Ser: 1.03 mg/dL (ref 0.40–1.50)
GFR: 90.88 mL/min (ref >60.00)
Glucose, Bld: 131 mg/dL — ABNORMAL HIGH (ref 70–99)
Potassium: 3.8 mEq/L (ref 3.5–5.1)
Sodium: 130 mEq/L — ABNORMAL LOW (ref 135–145)
TOTAL PROTEIN: 7.2 g/dL (ref 6.0–8.3)

## 2014-11-27 LAB — POCT URINALYSIS DIPSTICK
Glucose, UA: NEGATIVE
LEUKOCYTES UA: NEGATIVE
Nitrite, UA: NEGATIVE
PROTEIN UA: NEGATIVE
Spec Grav, UA: 1.015
Urobilinogen, UA: 0.2
pH, UA: 6

## 2014-11-27 LAB — CBC
HCT: 38.8 % — ABNORMAL LOW (ref 39.0–52.0)
HEMOGLOBIN: 12.9 g/dL — AB (ref 13.0–17.0)
MCHC: 33.1 g/dL (ref 30.0–36.0)
MCV: 88.2 fl (ref 78.0–100.0)
PLATELETS: 250 10*3/uL (ref 150.0–400.0)
RBC: 4.4 Mil/uL (ref 4.22–5.81)
RDW: 13.1 % (ref 11.5–15.5)
WBC: 11.5 10*3/uL — ABNORMAL HIGH (ref 4.0–10.5)

## 2014-11-27 MED ORDER — LEVOFLOXACIN 750 MG PO TABS: 750.0000 mg | ORAL_TABLET | Freq: Every day | ORAL | Status: DC

## 2014-11-27 NOTE — Patient Instructions (Signed)
We will call with your lab results.  Follow up next week if you fail to improve or worsen.  Take care  Dr. Lacinda Axon

## 2014-11-27 NOTE — Progress Notes (Signed)
Pre visit review using our clinic review tool, if applicable. No additional management support is needed unless otherwise documented below in the visit note. 

## 2014-11-27 NOTE — Progress Notes (Signed)
Subjective:  Patient ID: Adrian Cohen, male    DOB: [birth year]  Age: 74 y.o. MRN: [id number]  CC: Not feeling well, weakness/fatigue  HPI:  74 year old male presents to clinic today with complaints of not feeling well.  Patient states that he's not been feeling well since Monday. He states that he feels weak and doesn't feel like himself. He has had cough which is been mildly productive particularly in the morning. He denies any fever. He has had one episode of chills. He denies any shortness of breath, chest pain, nausea, vomiting. Additionally, he has decreased appetite. He is concerned that he may have pneumonia or an underlying UTI as this is how he's felt previously with these illnesses. He denies any dysuria, frequency/urgency. He does have nocturnal micturition which has been stable. No exacerbating or relieving factors.  Social Hx   Social History   Social History  . Marital Status: Married    Spouse Name: N/A  . Number of Children: N/A  . Years of Education: N/A   Social History Main Topics  . Smoking status: Former Smoker    Quit date: 02/02/1999  . Smokeless tobacco: Never Used     Comment: Quit in 2000  . Alcohol Use: 7.2 - 9.0 oz/week    12-15 Cans of beer per week  . Drug Use: No  . Sexual Activity: Yes   Other Topics Concern  . None   Social History Narrative   Review of Systems Per HPI Objective:  BP 104/58 mmHg  Pulse 105  Temp(Src) 97.6 F (36.4 C) (Oral)  Ht '5\' 11"'$  (1.803 m)  Wt 176 lb 2 oz (79.89 kg)  BMI 24.58 kg/m2  SpO2 92%  BP/Weight 11/27/2014 09/20/2014 06/28/7822  Systolic BP 235 361 443  Diastolic BP 58 68 70  Wt. (Lbs) 176.13 174.6 171.1  BMI 24.58 24.36 23.87   Physical Exam  Constitutional: He appears well-developed and well-nourished. No distress.  Cardiovascular: Normal rate.   Pulmonary/Chest: Effort normal and breath sounds normal.  Abdominal: Soft. He exhibits no distension. There is no tenderness. There is no rebound and  no guarding.  Neurological: He is alert.  Psychiatric: He has a normal mood and affect.  Vitals reviewed.  Lab Results  Component Value Date   WBC 3.0* 10/14/2014   HGB 14.0 10/14/2014   HCT 41.5 10/14/2014   PLT 263.0 10/14/2014   GLUCOSE 87 10/14/2014   CHOL 202* 10/14/2014   TRIG 83.0 10/14/2014   HDL 71.90 10/14/2014   LDLDIRECT 130.7 02/21/2013   LDLCALC 113* 10/14/2014   ALT 11 10/14/2014   AST 20 10/14/2014   NA 133* 10/14/2014   K 4.2 10/14/2014   CL 95* 10/14/2014   CREATININE 0.84 10/14/2014   BUN 7 10/14/2014   CO2 29 10/14/2014   TSH 1.00 04/23/2014   Dg Chest 2 View  11/27/2014  CLINICAL DATA:  CP and cough, previous history of tobacco use EXAM: CHEST  2 VIEW COMPARISON:  None in PACs FINDINGS: The lungs are hyperinflated with hemidiaphragm flattening. There are linear increased densities at the left lung base with mild blunting of the costophrenic angles. There is curvilinear density in the right perihilar region that likely reflects fibrosis mild right perihilar interstitial prominence is present as well. There is no pneumothorax nor large pleural effusion. The heart and pulmonary vascularity are normal. The bony thorax exhibits no acute abnormality. IMPRESSION: COPD. Left basilar subsegmental atelectasis, early infiltrate, or fibrosis is suspected. Right subsegmental atelectasis is likely  present as well. These findings may reflect superimposed acute bronchitis. Follow-up radiographs following anticipated antibiotic therapy are recommended to assure clearing. Chest CT scanning may be ultimately indicated to exclude occult malignancy. Electronically Signed   By: David  Martinique M.D.   On: 11/27/2014 15:12    Assessment & Plan:   Problem List Items Addressed This Visit    CAP (community acquired pneumonia) - Primary    Given symptomatology, chest x-ray was obtained today. Chest x-ray was independently reviewed by me. Left lower lobe atelectasis vs infiltrate.  I  suspect this is an early infiltrate. Treating for CAP with Levaquin.       Relevant Medications   levofloxacin (LEVAQUIN) 750 MG tablet    Other Visit Diagnoses    Other fatigue        Relevant Orders    POCT Urinalysis Dipstick (Completed)    DG Chest 2 View (Completed)    CBC    Comprehensive metabolic panel    Cough        Relevant Orders    DG Chest 2 View (Completed)    CBC    Comprehensive metabolic panel       Meds ordered this encounter  Medications  . levofloxacin (LEVAQUIN) 750 MG tablet    Sig: Take 1 tablet (750 mg total) by mouth daily.    Dispense:  5 tablet    Refill:  0    Follow-up: PRN  Thersa Salt, DO

## 2014-11-27 NOTE — Assessment & Plan Note (Signed)
Given symptomatology, chest x-ray was obtained today. Chest x-ray was independently reviewed by me. Left lower lobe atelectasis vs infiltrate.  I suspect this is an early infiltrate. Treating for CAP with Levaquin.

## 2014-12-04 ENCOUNTER — Telehealth: Payer: Self-pay | Admitting: Internal Medicine

## 2014-12-04 NOTE — Telephone Encounter (Signed)
Left msg for pt to call office to schedule AWV/msn

## 2014-12-09 ENCOUNTER — Other Ambulatory Visit: Payer: Self-pay | Admitting: Internal Medicine

## 2014-12-10 ENCOUNTER — Ambulatory Visit (INDEPENDENT_AMBULATORY_CARE_PROVIDER_SITE_OTHER): Payer: Medicare Other

## 2014-12-10 VITALS — BP 128/70 | HR 61 | Temp 98.0°F | Resp 14 | Ht 70.5 in | Wt 172.6 lb

## 2014-12-10 DIAGNOSIS — Z23 Encounter for immunization: Secondary | ICD-10-CM | POA: Diagnosis not present

## 2014-12-10 DIAGNOSIS — Z0000 Encounter for general adult medical examination without abnormal findings: Secondary | ICD-10-CM

## 2014-12-10 DIAGNOSIS — N62 Hypertrophy of breast: Secondary | ICD-10-CM

## 2014-12-10 NOTE — Patient Instructions (Addendum)
Adrian Cohen,  Thank you for taking time to come for your Medicare Wellness Visit.  I appreciate your ongoing commitment to your health goals. Please review the following plan we discussed and let me know if I can assist you in the future.  Bring a copy of completed advanced directives  Follow up mammogram as directed   Health Maintenance, Male A healthy lifestyle and preventative care can promote health and wellness.  Maintain regular health, dental, and eye exams.  Eat a healthy diet. Foods like vegetables, fruits, whole grains, low-fat dairy products, and lean protein foods contain the nutrients you need and are low in calories. Decrease your intake of foods high in solid fats, added sugars, and salt. Get information about a proper diet from your health care provider, if necessary.  Regular physical exercise is one of the most important things you can do for your health. Most adults should get at least 150 minutes of moderate-intensity exercise (any activity that increases your heart rate and causes you to sweat) each week. In addition, most adults need muscle-strengthening exercises on 2 or more days a week.   Maintain a healthy weight. The body mass index (BMI) is a screening tool to identify possible weight problems. It provides an estimate of body fat based on height and weight. Your health care provider can find your BMI and can help you achieve or maintain a healthy weight. For males 20 years and older:  A BMI below 18.5 is considered underweight.  A BMI of 18.5 to 24.9 is normal.  A BMI of 25 to 29.9 is considered overweight.  A BMI of 30 and above is considered obese.  Maintain normal blood lipids and cholesterol by exercising and minimizing your intake of saturated fat. Eat a balanced diet with plenty of fruits and vegetables. Blood tests for lipids and cholesterol should begin at age 15 and be repeated every 5 years. If your lipid or cholesterol levels are high, you are over  age 13, or you are at high risk for heart disease, you may need your cholesterol levels checked more frequently.Ongoing high lipid and cholesterol levels should be treated with medicines if diet and exercise are not working.  If you smoke, find out from your health care provider how to quit. If you do not use tobacco, do not start.  Lung cancer screening is recommended for adults aged 32-80 years who are at high risk for developing lung cancer because of a history of smoking. A yearly low-dose CT scan of the lungs is recommended for people who have at least a 30-pack-year history of smoking and are current smokers or have quit within the past 15 years. A pack year of smoking is smoking an average of 1 pack of cigarettes a day for 1 year (for example, a 30-pack-year history of smoking could mean smoking 1 pack a day for 30 years or 2 packs a day for 15 years). Yearly screening should continue until the smoker has stopped smoking for at least 15 years. Yearly screening should be stopped for people who develop a health problem that would prevent them from having lung cancer treatment.  If you choose to drink alcohol, do not have more than 2 drinks per day. One drink is considered to be 12 oz (360 mL) of beer, 5 oz (150 mL) of wine, or 1.5 oz (45 mL) of liquor.  Avoid the use of street drugs. Do not share needles with anyone. Ask for help if you need support  or instructions about stopping the use of drugs.  High blood pressure causes heart disease and increases the risk of stroke. High blood pressure is more likely to develop in:  People who have blood pressure in the end of the normal range (100-139/85-89 mm Hg).  People who are overweight or obese.  People who are African American.  If you are 65-74 years of age, have your blood pressure checked every 3-5 years. If you are 45 years of age or older, have your blood pressure checked every year. You should have your blood pressure measured twice--once  when you are at a hospital or clinic, and once when you are not at a hospital or clinic. Record the average of the two measurements. To check your blood pressure when you are not at a hospital or clinic, you can use:  An automated blood pressure machine at a pharmacy.  A home blood pressure monitor.  If you are 39-24 years old, ask your health care provider if you should take aspirin to prevent heart disease.  Diabetes screening involves taking a blood sample to check your fasting blood sugar level. This should be done once every 3 years after age 48 if you are at a normal weight and without risk factors for diabetes. Testing should be considered at a younger age or be carried out more frequently if you are overweight and have at least 1 risk factor for diabetes.  Colorectal cancer can be detected and often prevented. Most routine colorectal cancer screening begins at the age of 84 and continues through age 51. However, your health care provider may recommend screening at an earlier age if you have risk factors for colon cancer. On a yearly basis, your health care provider may provide home test kits to check for hidden blood in the stool. A small camera at the end of a tube may be used to directly examine the colon (sigmoidoscopy or colonoscopy) to detect the earliest forms of colorectal cancer. Talk to your health care provider about this at age 75 when routine screening begins. A direct exam of the colon should be repeated every 5-10 years through age 53, unless early forms of precancerous polyps or small growths are found.  People who are at an increased risk for hepatitis B should be screened for this virus. You are considered at high risk for hepatitis B if:  You were born in a country where hepatitis B occurs often. Talk with your health care provider about which countries are considered high risk.  Your parents were born in a high-risk country and you have not received a shot to protect  against hepatitis B (hepatitis B vaccine).  You have HIV or AIDS.  You use needles to inject street drugs.  You live with, or have sex with, someone who has hepatitis B.  You are a man who has sex with other men (MSM).  You get hemodialysis treatment.  You take certain medicines for conditions like cancer, organ transplantation, and autoimmune conditions.  Hepatitis C blood testing is recommended for all people born from [birth year] through 1964 and any individual with known risk factors for hepatitis C.  Healthy men should no longer receive prostate-specific antigen (PSA) blood tests as part of routine cancer screening. Talk to your health care provider about prostate cancer screening.  Testicular cancer screening is not recommended for adolescents or adult males who have no symptoms. Screening includes self-exam, a health care provider exam, and other screening tests. Consult with your health  care provider about any symptoms you have or any concerns you have about testicular cancer.  Practice safe sex. Use condoms and avoid high-risk sexual practices to reduce the spread of sexually transmitted infections (STIs).  You should be screened for STIs, including gonorrhea and chlamydia if:  You are sexually active and are younger than 24 years.  You are older than 24 years, and your health care provider tells you that you are at risk for this type of infection.  Your sexual activity has changed since you were last screened, and you are at an increased risk for chlamydia or gonorrhea. Ask your health care provider if you are at risk.  If you are at risk of being infected with HIV, it is recommended that you take a prescription medicine daily to prevent HIV infection. This is called pre-exposure prophylaxis (PrEP). You are considered at risk if:  You are a man who has sex with other men (MSM).  You are a heterosexual man who is sexually active with multiple partners.  You take drugs by  injection.  You are sexually active with a partner who has HIV.  Talk with your health care provider about whether you are at high risk of being infected with HIV. If you choose to begin PrEP, you should first be tested for HIV. You should then be tested every 3 months for as long as you are taking PrEP.  Use sunscreen. Apply sunscreen liberally and repeatedly throughout the day. You should seek shade when your shadow is shorter than you. Protect yourself by wearing long sleeves, pants, a wide-brimmed hat, and sunglasses year round whenever you are outdoors.  Tell your health care provider of new moles or changes in moles, especially if there is a change in shape or color. Also, tell your health care provider if a mole is larger than the size of a pencil eraser.  A one-time screening for abdominal aortic aneurysm (AAA) and surgical repair of large AAAs by ultrasound is recommended for men aged 72-75 years who are current or former smokers.  Stay current with your vaccines (immunizations).   This information is not intended to replace advice given to you by your health care provider. Make sure you discuss any questions you have with your health care provider.   Document Released: 07/17/2007 Document Revised: 02/08/2014 Document Reviewed: 06/15/2010 Elsevier Interactive Patient Education [employer].
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
## 2014-12-27 ENCOUNTER — Other Ambulatory Visit: Payer: Self-pay | Admitting: Internal Medicine

## 2015-01-15 ENCOUNTER — Ambulatory Visit (INDEPENDENT_AMBULATORY_CARE_PROVIDER_SITE_OTHER)
Admission: RE | Admit: 2015-01-15 | Discharge: 2015-01-15 | Disposition: A | Discharge status: Home / Self Care | Payer: Medicare Other | Source: Ambulatory Visit | Attending: Internal Medicine | Admitting: Internal Medicine

## 2015-01-15 ENCOUNTER — Encounter: Payer: Self-pay | Admitting: Internal Medicine

## 2015-01-15 ENCOUNTER — Ambulatory Visit (INDEPENDENT_AMBULATORY_CARE_PROVIDER_SITE_OTHER): Payer: Medicare Other | Admitting: Internal Medicine

## 2015-01-15 VITALS — BP 120/60 | HR 90 | Temp 97.4°F | Resp 18 | Ht 71.0 in | Wt 178.0 lb

## 2015-01-15 DIAGNOSIS — R938 Abnormal findings on diagnostic imaging of other specified body structures: Secondary | ICD-10-CM

## 2015-01-15 DIAGNOSIS — I10 Essential (primary) hypertension: Secondary | ICD-10-CM

## 2015-01-15 DIAGNOSIS — I739 Peripheral vascular disease, unspecified: Secondary | ICD-10-CM | POA: Diagnosis not present

## 2015-01-15 DIAGNOSIS — D72819 Decreased white blood cell count, unspecified: Secondary | ICD-10-CM

## 2015-01-15 DIAGNOSIS — K219 Gastro-esophageal reflux disease without esophagitis: Secondary | ICD-10-CM

## 2015-01-15 DIAGNOSIS — D649 Anemia, unspecified: Secondary | ICD-10-CM

## 2015-01-15 DIAGNOSIS — R9389 Abnormal findings on diagnostic imaging of other specified body structures: Secondary | ICD-10-CM

## 2015-01-15 DIAGNOSIS — R972 Elevated prostate specific antigen [PSA]: Secondary | ICD-10-CM

## 2015-01-15 DIAGNOSIS — Z86010 Personal history of colonic polyps: Secondary | ICD-10-CM

## 2015-01-15 DIAGNOSIS — E7800 Pure hypercholesterolemia, unspecified: Secondary | ICD-10-CM

## 2015-01-15 MED ORDER — SILDENAFIL CITRATE 50 MG PO TABS: 50.0000 mg | ORAL_TABLET | Freq: Every day | ORAL | Status: DC | PRN

## 2015-01-15 NOTE — Progress Notes (Signed)
Pre-visit discussion using our clinic review tool. No additional management support is needed unless otherwise documented below in the visit note.  
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
## 2015-01-20 ENCOUNTER — Encounter: Payer: Self-pay | Admitting: Internal Medicine

## 2015-01-20 NOTE — Assessment & Plan Note (Signed)
Blood pressure under good control.  Continue same medication regimen.  Follow pressures.  Follow metabolic panel.   

## 2015-01-20 NOTE — Assessment & Plan Note (Signed)
No pain with exercise or ambulation.  Continue daily aspirin.  Continue risk factor modification.

## 2015-01-20 NOTE — Assessment & Plan Note (Signed)
CXR as outlined.  Recheck cxr to confirm clearance.  Sees Dr Raul Del.

## 2015-01-20 NOTE — Assessment & Plan Note (Signed)
Colonoscopy 07/11/12 - tubular adenoma.  Recommended f/u colonoscopy in 07/2015.

## 2015-01-20 NOTE — Assessment & Plan Note (Signed)
No acid reflux symptoms.  On nexium.

## 2015-01-20 NOTE — Assessment & Plan Note (Signed)
Followed by Dr Cope.  

## 2015-01-20 NOTE — Assessment & Plan Note (Signed)
Low cholesterol diet and exercise.  Follow lipid panel.  On welchol.  Did not tolerate statin medication.

## 2015-01-20 NOTE — Assessment & Plan Note (Signed)
hgb has been stable.  Has been worked up by GI.  Follow cbc.

## 2015-01-20 NOTE — Assessment & Plan Note (Signed)
White count has been stable.  Follow cbc.

## 2015-01-22 ENCOUNTER — Ambulatory Visit (INDEPENDENT_AMBULATORY_CARE_PROVIDER_SITE_OTHER): Payer: Medicare Other | Admitting: Family Medicine

## 2015-01-22 ENCOUNTER — Encounter: Payer: Self-pay | Admitting: Family Medicine

## 2015-01-22 ENCOUNTER — Telehealth: Payer: Self-pay | Admitting: Internal Medicine

## 2015-01-22 VITALS — BP 110/70 | HR 99 | Temp 97.8°F | Ht 71.0 in | Wt 177.2 lb

## 2015-01-22 DIAGNOSIS — B370 Candidal stomatitis: Secondary | ICD-10-CM

## 2015-01-22 DIAGNOSIS — R042 Hemoptysis: Secondary | ICD-10-CM

## 2015-01-22 DIAGNOSIS — R05 Cough: Secondary | ICD-10-CM

## 2015-01-22 DIAGNOSIS — R059 Cough, unspecified: Secondary | ICD-10-CM

## 2015-01-22 MED ORDER — NYSTATIN 100000 UNIT/ML MT SUSP: 5.0000 mL | Freq: Four times a day (QID) | OROMUCOSAL | Status: DC

## 2015-01-22 NOTE — Progress Notes (Signed)
Subjective:  Patient ID: Adrian Cohen, male    DOB: 1940/11/20  Age: 74 y.o. MRN: [id number]  CC: Cough, weakness  HPI:  74 year old male with a past history of COPD (former smoker) presents to clinic today with complaints of weakness and productive cough.  Patient states that last night he developed cough with blood-tinged sputum. He reports that this morning he developed "weakness". No associated fevers or chills. He denies shortness of breath at this time (he states he does have some SOB with exertion; although he is able to exercise without difficulty).  No reports of chest pain. He states that he is concerned as he's recently had pneumonia and feels that he may be developing it again. He had a follow-up chest x-ray last week which revealed near complete resolution of prior pneumonia. It did reveal some scarring/atelectasis.  Social Hx   Social History   Social History  . Marital Status: Married    Spouse Name: N/A  . Number of Children: N/A  . Years of Education: N/A   Social History Main Topics  . Smoking status: Former Smoker    Quit date: 02/02/1999  . Smokeless tobacco: Never Used     Comment: Quit in 2000  . Alcohol Use: 7.2 - 9.0 oz/week    12-15 Cans of beer per week  . Drug Use: No  . Sexual Activity: Yes   Other Topics Concern  . None   Social History Narrative   Review of Systems  Constitutional: Positive for fatigue. Negative for fever.  Cardiovascular: Negative for chest pain.   Objective:  BP 110/70 mmHg  Pulse 99  Temp(Src) 97.8 F (36.6 C) (Oral)  Ht '5\' 11"'$  (1.803 m)  Wt 177 lb 4 oz (80.4 kg)  BMI 24.73 kg/m2  SpO2 93%  BP/Weight 01/22/2015 01/15/2015 67/09/9379  Systolic BP 017 510 258  Diastolic BP 70 60 70  Wt. (Lbs) 177.25 178 172.6  BMI 24.73 24.84 24.41   Physical Exam  Constitutional: He appears well-developed. No distress.  HENT:  Oropharynx with mild thrush.  Neck: Neck supple.  Cardiovascular: Normal rate and regular  rhythm.   Pulmonary/Chest: Effort normal and breath sounds normal. No respiratory distress. He has no wheezes. He has no rales.  Lymphadenopathy:    He has no cervical adenopathy.  Neurological: He is alert.  Psychiatric: He has a normal mood and affect.  Vitals reviewed.  Lab Results  Component Value Date   WBC 11.5* 11/27/2014   HGB 12.9* 11/27/2014   HCT 38.8* 11/27/2014   PLT 250.0 11/27/2014   GLUCOSE 131* 11/27/2014   CHOL 202* 10/14/2014   TRIG 83.0 10/14/2014   HDL 71.90 10/14/2014   LDLDIRECT 130.7 02/21/2013   LDLCALC 113* 10/14/2014   ALT 9 11/27/2014   AST 16 11/27/2014   NA 130* 11/27/2014   K 3.8 11/27/2014   CL 93* 11/27/2014   CREATININE 1.03 11/27/2014   BUN 7 11/27/2014   CO2 30 11/27/2014   TSH 1.00 04/23/2014    Assessment & Plan:   Problem List Items Addressed This Visit    Bloody sputum   Relevant Orders   CT Chest Wo Contrast   Cough - Primary    New productive cough. I discussed his case with his primary care physician and she agrees with my plan outlined below. I'm proceeding with CT the chest for further evaluation (he has had recent chest x-ray which revealed near resolution of his pneumonia but also revealed atelectasis/scarring and persistent  right perihilar density).       Relevant Orders   CT Chest Wo Contrast   Oral thrush    New problem. Noted on exam today. Treating with Nystatin.      Relevant Medications   nystatin (MYCOSTATIN) 100000 UNIT/ML suspension     Follow-up: PRN  [place]

## 2015-01-22 NOTE — Assessment & Plan Note (Addendum)
New productive cough. I discussed his case with his primary care physician and she agrees with my plan outlined below. I'm proceeding with CT the chest for further evaluation (he has had recent chest x-ray which revealed near resolution of his pneumonia but also revealed atelectasis/scarring and persistent right perihilar density).

## 2015-01-22 NOTE — Assessment & Plan Note (Signed)
New problem. Noted on exam today. Treating with Nystatin.

## 2015-01-22 NOTE — Telephone Encounter (Signed)
Patient was called to let him know that he has thrush and a prescription was sent in to treat it. This was likely due to use of inhalers.

## 2015-01-24 ENCOUNTER — Ambulatory Visit
Admission: RE | Admit: 2015-01-24 | Discharge: 2015-01-24 | Disposition: A | Discharge status: Home / Self Care | Payer: Medicare Other | Source: Ambulatory Visit | Attending: Family Medicine | Admitting: Family Medicine

## 2015-01-24 DIAGNOSIS — R042 Hemoptysis: Secondary | ICD-10-CM | POA: Diagnosis present

## 2015-01-24 DIAGNOSIS — I2510 Atherosclerotic heart disease of native coronary artery without angina pectoris: Secondary | ICD-10-CM | POA: Insufficient documentation

## 2015-01-24 DIAGNOSIS — R05 Cough: Secondary | ICD-10-CM | POA: Diagnosis present

## 2015-01-24 DIAGNOSIS — R059 Cough, unspecified: Secondary | ICD-10-CM

## 2015-01-24 DIAGNOSIS — I313 Pericardial effusion (noninflammatory): Secondary | ICD-10-CM | POA: Insufficient documentation

## 2015-01-24 DIAGNOSIS — R911 Solitary pulmonary nodule: Secondary | ICD-10-CM | POA: Insufficient documentation

## 2015-01-24 DIAGNOSIS — J439 Emphysema, unspecified: Secondary | ICD-10-CM | POA: Insufficient documentation

## 2015-01-29 ENCOUNTER — Other Ambulatory Visit: Payer: Medicare Other

## 2015-01-31 ENCOUNTER — Other Ambulatory Visit: Payer: Medicare Other

## 2015-02-04 ENCOUNTER — Telehealth: Payer: Self-pay | Admitting: *Deleted

## 2015-02-04 MED ORDER — PANTOPRAZOLE SODIUM 40 MG PO TBEC: 40.0000 mg | DELAYED_RELEASE_TABLET | Freq: Two times a day (BID) | ORAL | Status: DC

## 2015-02-04 NOTE — Telephone Encounter (Signed)
I spoke to pt.  He is willing to try protonix '40mg'$  bid.  The generic nexium does not work as well for him.  rx sent in for protonix '40mg'$  bid.  Pt aware.

## 2015-02-04 NOTE — Telephone Encounter (Signed)
Patient requested a pre-authorization for esomeprazole. The number to call for authorization will be [phone]. Patient was out of this medication and bought over the counter, he questioned if it would be ok to take the over the counter Nexium until his authorization is complete. Please advise

## 2015-02-05 ENCOUNTER — Other Ambulatory Visit (INDEPENDENT_AMBULATORY_CARE_PROVIDER_SITE_OTHER): Payer: Medicare Other

## 2015-02-05 DIAGNOSIS — I10 Essential (primary) hypertension: Secondary | ICD-10-CM | POA: Diagnosis not present

## 2015-02-05 DIAGNOSIS — D649 Anemia, unspecified: Secondary | ICD-10-CM | POA: Diagnosis not present

## 2015-02-05 DIAGNOSIS — E7800 Pure hypercholesterolemia, unspecified: Secondary | ICD-10-CM

## 2015-02-05 DIAGNOSIS — D72819 Decreased white blood cell count, unspecified: Secondary | ICD-10-CM | POA: Diagnosis not present

## 2015-02-05 LAB — LIPID PANEL
Cholesterol: 220 mg/dL — ABNORMAL HIGH (ref 0–200)
HDL: 63 mg/dL (ref >39.00)
LDL Cholesterol: 143 mg/dL — ABNORMAL HIGH (ref 0–99)
NONHDL: 156.51
Total CHOL/HDL Ratio: 3
Triglycerides: 66 mg/dL (ref 0.0–149.0)
VLDL: 13.2 mg/dL (ref 0.0–40.0)

## 2015-02-05 LAB — CBC WITH DIFFERENTIAL/PLATELET
BASOS PCT: 0.4 % (ref 0.0–3.0)
Basophils Absolute: 0 10*3/uL (ref 0.0–0.1)
EOS ABS: 0 10*3/uL (ref 0.0–0.7)
EOS PCT: 1.2 % (ref 0.0–5.0)
HEMATOCRIT: 41.6 % (ref 39.0–52.0)
Hemoglobin: 13.6 g/dL (ref 13.0–17.0)
Lymphocytes Relative: 22.9 % (ref 12.0–46.0)
Lymphs Abs: 0.9 10*3/uL (ref 0.7–4.0)
MCHC: 32.7 g/dL (ref 30.0–36.0)
MCV: 88.4 fl (ref 78.0–100.0)
MONOS PCT: 12.9 % — AB (ref 3.0–12.0)
Monocytes Absolute: 0.5 10*3/uL (ref 0.1–1.0)
Neutro Abs: 2.5 10*3/uL (ref 1.4–7.7)
Neutrophils Relative %: 62.6 % (ref 43.0–77.0)
PLATELETS: 402 10*3/uL — AB (ref 150.0–400.0)
RBC: 4.7 Mil/uL (ref 4.22–5.81)
RDW: 13.7 % (ref 11.5–15.5)
WBC: 4.1 10*3/uL (ref 4.0–10.5)

## 2015-02-05 LAB — BASIC METABOLIC PANEL WITH GFR
BUN: 7 mg/dL (ref 6–23)
CO2: 31 meq/L (ref 19–32)
Calcium: 9.7 mg/dL (ref 8.4–10.5)
Chloride: 96 meq/L (ref 96–112)
Creatinine, Ser: 0.82 mg/dL (ref 0.40–1.50)
GFR: 118.17 mL/min
Glucose, Bld: 96 mg/dL (ref 70–99)
Potassium: 4 meq/L (ref 3.5–5.1)
Sodium: 134 meq/L — ABNORMAL LOW (ref 135–145)

## 2015-02-05 LAB — HEPATIC FUNCTION PANEL
ALT: 11 U/L (ref 0–53)
AST: 18 U/L (ref 0–37)
Albumin: 4.3 g/dL (ref 3.5–5.2)
Alkaline Phosphatase: 56 U/L (ref 39–117)
BILIRUBIN DIRECT: 0.1 mg/dL (ref 0.0–0.3)
Total Bilirubin: 0.5 mg/dL (ref 0.2–1.2)
Total Protein: 7.2 g/dL (ref 6.0–8.3)

## 2015-02-06 ENCOUNTER — Other Ambulatory Visit: Payer: Self-pay | Admitting: Internal Medicine

## 2015-02-06 ENCOUNTER — Encounter: Payer: Self-pay | Admitting: Cardiothoracic Surgery

## 2015-02-06 ENCOUNTER — Inpatient Hospital Stay: Payer: Medicare Other | Attending: Cardiothoracic Surgery | Admitting: Cardiothoracic Surgery

## 2015-02-06 ENCOUNTER — Encounter: Payer: Self-pay | Admitting: *Deleted

## 2015-02-06 VITALS — BP 112/65 | HR 90 | Temp 96.2°F | Ht 71.0 in | Wt 176.4 lb

## 2015-02-06 DIAGNOSIS — D75839 Thrombocytosis, unspecified: Secondary | ICD-10-CM

## 2015-02-06 DIAGNOSIS — R918 Other nonspecific abnormal finding of lung field: Secondary | ICD-10-CM

## 2015-02-06 DIAGNOSIS — D473 Essential (hemorrhagic) thrombocythemia: Secondary | ICD-10-CM

## 2015-02-06 NOTE — Progress Notes (Signed)
Order placed for f/u platelet count.  
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
## 2015-02-11 ENCOUNTER — Telehealth: Payer: Self-pay | Admitting: Internal Medicine

## 2015-02-11 ENCOUNTER — Ambulatory Visit: Payer: Medicare Other | Attending: Cardiothoracic Surgery

## 2015-02-11 ENCOUNTER — Other Ambulatory Visit: Payer: Self-pay | Admitting: Cardiothoracic Surgery

## 2015-02-11 ENCOUNTER — Telehealth: Payer: Self-pay | Admitting: *Deleted

## 2015-02-11 ENCOUNTER — Ambulatory Visit: Admission: RE | Admit: 2015-02-11 | Payer: Medicare Other | Source: Ambulatory Visit

## 2015-02-11 DIAGNOSIS — R918 Other nonspecific abnormal finding of lung field: Secondary | ICD-10-CM

## 2015-02-11 NOTE — Telephone Encounter (Signed)
Do you know anything about this medication? I do not see this on his list.

## 2015-02-11 NOTE — Progress Notes (Signed)
Met with patient at thoracic surgery consultation appointment. Introduced navigation program and reviewed plan of care. Will follow.  

## 2015-02-11 NOTE — Telephone Encounter (Signed)
Pt called stating that a medication of esonetrazole '40mg'$  he states it does not work that well. He has to take some tums in between. Pt states Dr Nicki Reaper was going to send something else. Pharmacy is [facility], [place]. Call pt @ [phone]. Thank You!

## 2015-02-11 NOTE — Telephone Encounter (Signed)
Patient called to report has weather related issues and had to reschedule the pet scan for this Friday, the 13th. Will follow.

## 2015-02-11 NOTE — Telephone Encounter (Signed)
Please advise? Not sure what medication he is referring to that was sent.

## 2015-02-12 NOTE — Telephone Encounter (Signed)
I can send in rx for protonix '40mg'$  bid.  Does he want [facility] or [facility] - since having symptoms now?

## 2015-02-13 NOTE — Telephone Encounter (Signed)
[facility] Not having any symptoms now.

## 2015-02-14 ENCOUNTER — Ambulatory Visit
Admission: RE | Admit: 2015-02-14 | Discharge: 2015-02-14 | Disposition: A | Discharge status: Home / Self Care | Payer: Medicare Other | Source: Ambulatory Visit | Attending: Cardiothoracic Surgery | Admitting: Cardiothoracic Surgery

## 2015-02-14 ENCOUNTER — Other Ambulatory Visit: Payer: Self-pay | Admitting: Internal Medicine

## 2015-02-14 DIAGNOSIS — I700 Atherosclerosis of aorta: Secondary | ICD-10-CM | POA: Insufficient documentation

## 2015-02-14 DIAGNOSIS — R918 Other nonspecific abnormal finding of lung field: Secondary | ICD-10-CM | POA: Diagnosis present

## 2015-02-14 DIAGNOSIS — Z0189 Encounter for other specified special examinations: Secondary | ICD-10-CM | POA: Diagnosis present

## 2015-02-14 DIAGNOSIS — R938 Abnormal findings on diagnostic imaging of other specified body structures: Secondary | ICD-10-CM | POA: Diagnosis not present

## 2015-02-14 DIAGNOSIS — J439 Emphysema, unspecified: Secondary | ICD-10-CM | POA: Insufficient documentation

## 2015-02-14 LAB — GLUCOSE, CAPILLARY: GLUCOSE-CAPILLARY: 105 mg/dL — AB (ref 65–99)

## 2015-02-14 MED ORDER — PANTOPRAZOLE SODIUM 40 MG PO TBEC: 40.0000 mg | DELAYED_RELEASE_TABLET | Freq: Two times a day (BID) | ORAL | Status: DC

## 2015-02-14 MED ORDER — FLUDEOXYGLUCOSE F - 18 (FDG) INJECTION
12.2700 | Freq: Once | INTRAVENOUS | Status: AC | PRN
  Administered 2015-02-14: 12.27 via INTRAVENOUS

## 2015-02-14 NOTE — Progress Notes (Signed)
[facility] for bid protonix.

## 2015-02-17 ENCOUNTER — Telehealth: Payer: Self-pay | Admitting: Internal Medicine

## 2015-02-17 NOTE — Telephone Encounter (Signed)
Pt called about phone call he made to office on 02/11/15. Wanting to know if his prescription has been filled. You can call him on his cell number [phone]. This is about his pantoprazole (PROTONIX) 40 MG tablet

## 2015-02-17 NOTE — Telephone Encounter (Signed)
Per the chart, [facility].  Spoke with the patient, verbalized that we sent the prescription.  He will [facility] to follow up. Thanks

## 2015-02-18 ENCOUNTER — Other Ambulatory Visit: Payer: Self-pay | Admitting: Internal Medicine

## 2015-02-18 NOTE — Telephone Encounter (Signed)
Patient stated that Rx needed coverage review. [facility] [phone] Please call patient, to update him wit any information [phone]

## 2015-02-19 NOTE — Telephone Encounter (Signed)
Not approved through his HMO Medicare patient will be calling us with a drug that is approved in his [place].

## 2015-02-19 NOTE — Telephone Encounter (Signed)
Completed the coverage review.  Patient has only HMO, unable to get Protonix as it is Part D medication.  Patient will look at his book to see what is covered in his HMO and let us know what to order.

## 2015-02-19 NOTE — Telephone Encounter (Signed)
Pantoprazole approved Effective 01/28/15-02/18/16.

## 2015-02-19 NOTE — Telephone Encounter (Signed)
Spoke to Santiago Glad at [facility] to get authorization for the Protonix. Per his insurance he does not have part D Coverage. This medication is not covered under his insurance. Spoke to patient to confirm he does not have a Part D coverage. He confirmed he does not. He is going to find out a list of Acid Reflux medications that will be covered and contact our office back to inform us what he is willing to try.

## 2015-02-20 ENCOUNTER — Inpatient Hospital Stay (HOSPITAL_BASED_OUTPATIENT_CLINIC_OR_DEPARTMENT_OTHER): Payer: Medicare Other | Admitting: Cardiothoracic Surgery

## 2015-02-20 ENCOUNTER — Ambulatory Visit: Payer: Medicare Other | Attending: Cardiothoracic Surgery

## 2015-02-20 VITALS — BP 110/72 | HR 85 | Temp 97.0°F | Resp 18 | Wt 174.9 lb

## 2015-02-20 DIAGNOSIS — Z0189 Encounter for other specified special examinations: Secondary | ICD-10-CM | POA: Diagnosis not present

## 2015-02-20 DIAGNOSIS — N63 Unspecified lump in unspecified breast: Secondary | ICD-10-CM

## 2015-02-20 DIAGNOSIS — Z87891 Personal history of nicotine dependence: Secondary | ICD-10-CM | POA: Insufficient documentation

## 2015-02-20 DIAGNOSIS — I739 Peripheral vascular disease, unspecified: Secondary | ICD-10-CM | POA: Diagnosis not present

## 2015-02-20 DIAGNOSIS — E7800 Pure hypercholesterolemia, unspecified: Secondary | ICD-10-CM | POA: Insufficient documentation

## 2015-02-20 DIAGNOSIS — R918 Other nonspecific abnormal finding of lung field: Secondary | ICD-10-CM | POA: Diagnosis not present

## 2015-02-20 DIAGNOSIS — I10 Essential (primary) hypertension: Secondary | ICD-10-CM | POA: Diagnosis not present

## 2015-02-20 NOTE — Progress Notes (Signed)
Adrian Cohen Inpatient Post-Op Note  Patient ID: Adrian Cohen, male   DOB: 01-04-1941, 75 y.o.   MRN: [id number]  HISTORY: He returns today in follow-up. He does not complain of any shortness of breath. Said no cough fevers or chills. He did undergo a PET scan which showed uptake in the 3 lesions in the left upper lobe as well as in the right breast. He had undergone a subcutaneous mastectomy for gynecomastia bilaterally about one year ago. The pathology was reviewed and it showed only fibroadipose tissue. There is no evidence of malignancy in the specimen.   Filed Vitals:   02/20/15 1003  BP: 110/72  Pulse: 85  Temp: 97 F (36.1 C)  Resp: 18     EXAM: Resp: Lungs are clear bilaterally.  No respiratory distress, normal effort. Heart:  Regular without murmurs Abd:  Abdomen is soft, non distended and non tender. No masses are palpable.  There is no rebound and no guarding.  Neurological: Alert and oriented to person, place, and time. Coordination normal.  Skin: Skin is warm and dry. No rash noted. No diaphoretic. No erythema. No pallor.  Psychiatric: Normal mood and affect. Normal behavior. Judgment and thought content normal. The right breast was carefully examined. There is a small mildly keloid along the incision near the areola. There is no definite palpable mass. Some subcutaneous fibrotic tissue but nothing that it would appear to be like a breast tumor.    ASSESSMENT: The CT scan shows 3 lesions in the left upper lobe all which are concerning for malignancy. I presented his case today at our multidisciplinary thoracic oncology clinic and it was felt that the best option would be to biopsy the right breast and if negative to resect the left upper lobe. 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   PLAN:   I told the patient that we would contact him to go over the results of our multidisciplinary clinic. Will set him up to have a CT-guided or  ultrasound-guided biopsy of the right breast and then a subsequent follow-up with me for surgery.    Nestor Lewandowsky, MD

## 2015-02-21 ENCOUNTER — Other Ambulatory Visit: Payer: Self-pay | Admitting: *Deleted

## 2015-02-21 DIAGNOSIS — N63 Unspecified lump in unspecified breast: Secondary | ICD-10-CM

## 2015-02-25 ENCOUNTER — Other Ambulatory Visit: Payer: Self-pay

## 2015-02-25 DIAGNOSIS — N63 Unspecified lump in unspecified breast: Secondary | ICD-10-CM

## 2015-02-27 ENCOUNTER — Ambulatory Visit
Admission: RE | Admit: 2015-02-27 | Discharge: 2015-02-27 | Disposition: A | Discharge status: Home / Self Care | Payer: Medicare Other | Source: Ambulatory Visit | Attending: Cardiothoracic Surgery | Admitting: Cardiothoracic Surgery

## 2015-02-27 DIAGNOSIS — N63 Unspecified lump in unspecified breast: Secondary | ICD-10-CM

## 2015-02-27 DIAGNOSIS — Z9013 Acquired absence of bilateral breasts and nipples: Secondary | ICD-10-CM | POA: Insufficient documentation

## 2015-02-27 DIAGNOSIS — R918 Other nonspecific abnormal finding of lung field: Secondary | ICD-10-CM | POA: Insufficient documentation

## 2015-02-28 LAB — SURGICAL PATHOLOGY

## 2015-03-04 ENCOUNTER — Other Ambulatory Visit: Payer: Self-pay

## 2015-03-04 ENCOUNTER — Telehealth: Payer: Self-pay | Admitting: Internal Medicine

## 2015-03-04 MED ORDER — PANTOPRAZOLE SODIUM 40 MG PO TBEC: 40.0000 mg | DELAYED_RELEASE_TABLET | Freq: Two times a day (BID) | ORAL | Status: DC

## 2015-03-04 NOTE — Telephone Encounter (Signed)
Filled

## 2015-03-04 NOTE — Telephone Encounter (Signed)
Protonix filled

## 2015-03-04 NOTE — Telephone Encounter (Signed)
Pt called to check the status of the medication pantoprazole (PROTONIX) 40 MG tablet. Pt states he received a letter which stated he was prior approved until next year. Pharmacy is [place], [place]. Call pt @ [phone]. Pt needs a refill for 14 day supply to [place], [place]. Thank you!

## 2015-03-06 ENCOUNTER — Inpatient Hospital Stay: Payer: Medicare Other | Admitting: Cardiothoracic Surgery

## 2015-03-06 ENCOUNTER — Other Ambulatory Visit: Payer: Self-pay

## 2015-03-06 ENCOUNTER — Telehealth: Payer: Self-pay | Admitting: Internal Medicine

## 2015-03-06 ENCOUNTER — Other Ambulatory Visit: Payer: Medicare Other

## 2015-03-06 MED ORDER — PANTOPRAZOLE SODIUM 40 MG PO TBEC: 40.0000 mg | DELAYED_RELEASE_TABLET | Freq: Two times a day (BID) | ORAL | Status: DC

## 2015-03-06 NOTE — Telephone Encounter (Signed)
20 pills sent to [place] until mail order is sent to pt

## 2015-03-06 NOTE — Telephone Encounter (Signed)
Short term fill until script is sent to pt mail order.

## 2015-03-06 NOTE — Telephone Encounter (Signed)
Pt called needing some pills until the [facility] arrives [facility] Medication is pantoprazole (PROTONIX) 40 MG tablet. Pharmacy is [place], [place]. Call pt @ [phone]. Thank you!

## 2015-03-07 ENCOUNTER — Inpatient Hospital Stay: Payer: Medicare Other | Attending: Cardiothoracic Surgery | Admitting: Cardiothoracic Surgery

## 2015-03-07 VITALS — BP 162/84 | HR 98 | Temp 98.7°F | Resp 18 | Wt 173.5 lb

## 2015-03-07 DIAGNOSIS — R918 Other nonspecific abnormal finding of lung field: Secondary | ICD-10-CM

## 2015-03-07 DIAGNOSIS — C3490 Malignant neoplasm of unspecified part of unspecified bronchus or lung: Secondary | ICD-10-CM | POA: Diagnosis present

## 2015-03-07 NOTE — Progress Notes (Signed)
Adrian Cohen Inpatient Post-Op Note  Patient ID: Adrian Cohen, male   DOB: [birth year], 76 y.o.   MRN: [id number]  HISTORY: [name] returns today in follow-up. He has no new complaints. He tolerated his ultrasound-guided breast biopsy without difficulties. He denies any recent fevers or chills. He denied any smoking.   Filed Vitals:   03/07/15 0955  BP: 162/84  Pulse: 98  Temp: 98.7 F (37.1 C)  Resp: 18     EXAM: Resp: Lungs are clear bilaterally.  No respiratory distress, normal effort. Heart:  Regular without murmurs Abd:  Abdomen is soft, non distended and non tender. No masses are palpable.  There is no rebound and no guarding.  Neurological: Alert and oriented to person, place, and time. Coordination normal.  Skin: Skin is warm and dry. No rash noted. No diaphoretic. No erythema. No pallor.  Psychiatric: Normal mood and affect. Normal behavior. Judgment and thought content normal.    ASSESSMENT: He was accompanied today by his daughter. We spent approximately 30 minutes reviewing the options. I reviewed with them in great detail the indications and risks of left thoracotomy with left upper lobectomy. I reviewed with them the risks of bleeding, infection, air leak and death.  I reviewed the postoperative care. They had a lot of questions about alternative treatments. I did explain to them that we presented this case at our multi-disciplinary thoracic oncology Board. It was the recommendation that he undergo left upper lobectomy for management of his presumed lung cancer.   PLAN:   After extensive discussion the patient would like to think about his options and discuss his care with his other family members. He will contact us if he wishes [place] to proceed with scheduling his surgery.    Nestor Lewandowsky, MD

## 2015-03-11 ENCOUNTER — Telehealth: Payer: Self-pay | Admitting: *Deleted

## 2015-03-11 NOTE — Telephone Encounter (Signed)
Received call from patient reporting that he will be seeking a second opinion from [facility] and will let us know what he decides to do. Patient understands he can call for any needed assistance.

## 2015-03-11 NOTE — Telephone Encounter (Signed)
I spoke with Mr. Mcknight.  There has been confusion with this prescription.  He was not approved from a drug standpoint, only the quantity.  I explained this to him again and that due to not having Part D coverage I need him to look in his book and let us know what medications are covered to get him on a medication asap.  He verbalized understanding and will return a call to the office.

## 2015-03-13 ENCOUNTER — Other Ambulatory Visit: Payer: Medicare Other

## 2015-03-13 ENCOUNTER — Telehealth: Payer: Self-pay | Admitting: Internal Medicine

## 2015-03-13 ENCOUNTER — Other Ambulatory Visit (INDEPENDENT_AMBULATORY_CARE_PROVIDER_SITE_OTHER): Payer: Medicare Other

## 2015-03-13 DIAGNOSIS — D75839 Thrombocytosis, unspecified: Secondary | ICD-10-CM

## 2015-03-13 DIAGNOSIS — D473 Essential (hemorrhagic) thrombocythemia: Secondary | ICD-10-CM

## 2015-03-13 LAB — PLATELET COUNT: Platelets: 282 10*3/uL (ref 150–400)

## 2015-03-13 NOTE — Telephone Encounter (Signed)
[facility]oke with [facility], they will fill the prescription for 90days.  Confirmed with patient and he will try to get short supply at [facility].

## 2015-03-13 NOTE — Telephone Encounter (Signed)
Pt called about his prescription of pantoprazole (PROTONIX) 40 MG tablet that was approved from 01/28/2015-02/18/2016 through [place], [place]. [facility] due to prescription not fully filled. [facility] Ref# number [id number]. Call pt @ [phone]. Thank you!

## 2015-03-13 NOTE — Telephone Encounter (Signed)
[place]. Thank you!

## 2015-03-21 DIAGNOSIS — D491 Neoplasm of unspecified behavior of respiratory system: Secondary | ICD-10-CM | POA: Insufficient documentation

## 2015-03-21 DIAGNOSIS — R9389 Abnormal findings on diagnostic imaging of other specified body structures: Secondary | ICD-10-CM | POA: Insufficient documentation

## 2015-04-25 ENCOUNTER — Telehealth: Payer: Self-pay | Admitting: Internal Medicine

## 2015-04-25 NOTE — Telephone Encounter (Signed)
Pt called to state that he had seen Dr. Queen Blossom. He was diagnosed with lung cancer. Dr. Genevive Bi requested that he have a second opinion. He was then seen by an MD at [facility]. He has plans to have a robotic surgery but wanted to speak with Dr. Nicki Reaper before having the surgery. Pt requests a call from Dr. Nicki Reaper. msn

## 2015-04-27 NOTE — Telephone Encounter (Signed)
Called pt for update.  He will keep me posted.

## 2015-05-06 ENCOUNTER — Telehealth: Payer: Self-pay | Admitting: *Deleted

## 2015-05-06 NOTE — Telephone Encounter (Signed)
Contacted patient in follow up since biopsy and diagnosis at [facility]. Patient reports he is having robotic surgery for his lung cancer. Offered services here in the future if needed. Patient verbalized understanding and is very appreciative of the care we have provided.

## 2015-05-22 ENCOUNTER — Encounter: Payer: Self-pay | Admitting: Internal Medicine

## 2015-05-22 ENCOUNTER — Ambulatory Visit (INDEPENDENT_AMBULATORY_CARE_PROVIDER_SITE_OTHER): Payer: Medicare Other | Admitting: Internal Medicine

## 2015-05-22 VITALS — BP 110/70 | HR 81 | Temp 97.8°F | Resp 18 | Ht 69.25 in | Wt 170.0 lb

## 2015-05-22 DIAGNOSIS — C3412 Malignant neoplasm of upper lobe, left bronchus or lung: Secondary | ICD-10-CM

## 2015-05-22 DIAGNOSIS — E7800 Pure hypercholesterolemia, unspecified: Secondary | ICD-10-CM

## 2015-05-22 DIAGNOSIS — R0989 Other specified symptoms and signs involving the circulatory and respiratory systems: Secondary | ICD-10-CM | POA: Diagnosis not present

## 2015-05-22 DIAGNOSIS — I10 Essential (primary) hypertension: Secondary | ICD-10-CM | POA: Diagnosis not present
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
## 2015-05-22 NOTE — Progress Notes (Signed)
Pre-visit discussion using our clinic review tool. No additional management support is needed unless otherwise documented below in the visit note.  

## 2015-05-23 ENCOUNTER — Encounter: Payer: Self-pay | Admitting: Internal Medicine

## 2015-05-23 DIAGNOSIS — R0989 Other specified symptoms and signs involving the circulatory and respiratory systems: Secondary | ICD-10-CM | POA: Insufficient documentation

## 2015-05-23 DIAGNOSIS — C3490 Malignant neoplasm of unspecified part of unspecified bronchus or lung: Secondary | ICD-10-CM | POA: Insufficient documentation

## 2015-05-23 NOTE — Assessment & Plan Note (Signed)
Blood pressure under good control.  Continue same medication regimen.  Follow pressures.  Follow metabolic panel.   

## 2015-05-23 NOTE — Assessment & Plan Note (Signed)
On welchol.  Follow lipid panel.  Low cholesterol diet and exercise.

## 2015-05-23 NOTE — Assessment & Plan Note (Signed)
Obtain carotid ultrasound.

## 2015-05-23 NOTE — Assessment & Plan Note (Signed)
Being followed at [facility].  W/up as outlined.  Planning for surgery as outlined.  Follow.  Currently doing well.

## 2015-05-27 ENCOUNTER — Other Ambulatory Visit (INDEPENDENT_AMBULATORY_CARE_PROVIDER_SITE_OTHER): Payer: Medicare Other

## 2015-05-27 DIAGNOSIS — E7800 Pure hypercholesterolemia, unspecified: Secondary | ICD-10-CM

## 2015-05-27 DIAGNOSIS — I10 Essential (primary) hypertension: Secondary | ICD-10-CM

## 2015-05-27 LAB — HEPATIC FUNCTION PANEL
ALBUMIN: 4.2 g/dL (ref 3.5–5.2)
ALT: 9 U/L (ref 0–53)
AST: 16 U/L (ref 0–37)
Alkaline Phosphatase: 53 U/L (ref 39–117)
BILIRUBIN TOTAL: 0.4 mg/dL (ref 0.2–1.2)
Bilirubin, Direct: 0.1 mg/dL (ref 0.0–0.3)
Total Protein: 7.1 g/dL (ref 6.0–8.3)

## 2015-05-27 LAB — LIPID PANEL
CHOLESTEROL: 212 mg/dL — AB (ref 0–200)
HDL: 76.6 mg/dL (ref >39.00)
LDL Cholesterol: 118 mg/dL — ABNORMAL HIGH (ref 0–99)
NonHDL: 135.42
Total CHOL/HDL Ratio: 3
Triglycerides: 85 mg/dL (ref 0.0–149.0)
VLDL: 17 mg/dL (ref 0.0–40.0)

## 2015-05-27 LAB — CBC WITH DIFFERENTIAL/PLATELET
BASOS ABS: 0 10*3/uL (ref 0.0–0.1)
Basophils Relative: 0.6 % (ref 0.0–3.0)
EOS ABS: 0.1 10*3/uL (ref 0.0–0.7)
Eosinophils Relative: 2.5 % (ref 0.0–5.0)
HCT: 39.2 % (ref 39.0–52.0)
HEMOGLOBIN: 13 g/dL (ref 13.0–17.0)
LYMPHS PCT: 35.5 % (ref 12.0–46.0)
Lymphs Abs: 1.1 10*3/uL (ref 0.7–4.0)
MCHC: 33.2 g/dL (ref 30.0–36.0)
MCV: 87.3 fl (ref 78.0–100.0)
MONOS PCT: 17.1 % — AB (ref 3.0–12.0)
Monocytes Absolute: 0.5 10*3/uL (ref 0.1–1.0)
NEUTROS ABS: 1.4 10*3/uL (ref 1.4–7.7)
Neutrophils Relative %: 44.3 % (ref 43.0–77.0)
Platelets: 338 10*3/uL (ref 150.0–400.0)
RBC: 4.49 Mil/uL (ref 4.22–5.81)
RDW: 13.7 % (ref 11.5–15.5)
WBC: 3.2 10*3/uL — AB (ref 4.0–10.5)

## 2015-05-27 LAB — BASIC METABOLIC PANEL
BUN: 6 mg/dL (ref 6–23)
CALCIUM: 9.4 mg/dL (ref 8.4–10.5)
CO2: 31 mEq/L (ref 19–32)
Chloride: 96 mEq/L (ref 96–112)
Creatinine, Ser: 0.85 mg/dL (ref 0.40–1.50)
GFR: 113.28 mL/min (ref >60.00)
GLUCOSE: 94 mg/dL (ref 70–99)
Potassium: 3.4 mEq/L — ABNORMAL LOW (ref 3.5–5.1)
SODIUM: 134 meq/L — AB (ref 135–145)

## 2015-05-27 LAB — TSH: TSH: 0.61 u[IU]/mL (ref 0.35–4.50)

## 2015-05-28 ENCOUNTER — Encounter: Payer: Self-pay | Admitting: *Deleted

## 2015-05-28 ENCOUNTER — Other Ambulatory Visit: Payer: Self-pay | Admitting: Internal Medicine

## 2015-05-28 DIAGNOSIS — E876 Hypokalemia: Secondary | ICD-10-CM

## 2015-05-28 NOTE — Progress Notes (Signed)
Order placed for f/u potassium.  

## 2015-06-03 ENCOUNTER — Other Ambulatory Visit (INDEPENDENT_AMBULATORY_CARE_PROVIDER_SITE_OTHER): Payer: Medicare Other

## 2015-06-03 DIAGNOSIS — E876 Hypokalemia: Secondary | ICD-10-CM | POA: Diagnosis not present

## 2015-06-03 LAB — POTASSIUM: Potassium: 3.8 mEq/L (ref 3.5–5.1)

## 2015-06-09 ENCOUNTER — Other Ambulatory Visit: Payer: Medicare Other

## 2015-06-16 ENCOUNTER — Telehealth: Payer: Self-pay | Admitting: Internal Medicine

## 2015-06-16 NOTE — Telephone Encounter (Signed)
Was taking amlodipine  And Accupril which were both stopped. Started on  Diltiatzem, '120mg'$  24 hour capsule take once a day. And  Metoprolol tartrate '50mg'$  twice a day  Pain- not taking any was refusing all pain meds at the hospital, daughter wants to know if you can prescribe a stronger tylenol versus OTC. thanks

## 2015-06-16 NOTE — Telephone Encounter (Signed)
Pt had lung surgery, was release from hospital on Saturday. Pt wants to know if Dr. Nicki Reaper should check his medications.

## 2015-06-16 NOTE — Telephone Encounter (Signed)
I can check his medications if he desires.  Need to know what he is taking.  Also, confirm he is doing ok.

## 2015-06-16 NOTE — Telephone Encounter (Signed)
Notify pt to continue the metoprolol and diltiazem for now.  We may need to change or add medication the further he gets from the surgery.  If he needs/wants me to see him, I can see him Wednesday 06/18/15 at 2:30 for hospital f/u and for medication management.

## 2015-06-16 NOTE — Telephone Encounter (Signed)
He gladly accepted the appt with you on Wednesday, thanks

## 2015-06-16 NOTE — Telephone Encounter (Signed)
Spoke with the patient, he said that Dr. Chaney Born? Changed his BP meds, that there are new ones at the pharmacy to pick up and he is not sure that you want him to take them.  I asked if he knew the new medications or the changes that they made upon discharge and he was not sure, his daughter was out getting him some stool softners currently so he is going to call back.   He said he is doing okay, has no appetite at all, trying to drink ensure and added yogurt to his diet.  Wishes that he had an appetite.  Other wise he feels good.

## 2015-06-16 NOTE — Telephone Encounter (Signed)
Please advise 

## 2015-06-18 ENCOUNTER — Emergency Department: Payer: Medicare Other

## 2015-06-18 ENCOUNTER — Encounter: Payer: Self-pay | Admitting: Emergency Medicine

## 2015-06-18 ENCOUNTER — Ambulatory Visit: Payer: Medicare Other | Admitting: Internal Medicine

## 2015-06-18 ENCOUNTER — Other Ambulatory Visit: Payer: Self-pay | Admitting: Internal Medicine

## 2015-06-18 ENCOUNTER — Emergency Department
Admission: EM | Admit: 2015-06-18 | Discharge: 2015-06-18 | Disposition: A | Discharge status: Home / Self Care | Payer: Medicare Other | Attending: Emergency Medicine | Admitting: Emergency Medicine

## 2015-06-18 DIAGNOSIS — Z7982 Long term (current) use of aspirin: Secondary | ICD-10-CM | POA: Insufficient documentation

## 2015-06-18 DIAGNOSIS — J449 Chronic obstructive pulmonary disease, unspecified: Secondary | ICD-10-CM | POA: Diagnosis not present

## 2015-06-18 DIAGNOSIS — K5900 Constipation, unspecified: Secondary | ICD-10-CM | POA: Diagnosis present

## 2015-06-18 DIAGNOSIS — C3490 Malignant neoplasm of unspecified part of unspecified bronchus or lung: Secondary | ICD-10-CM | POA: Insufficient documentation

## 2015-06-18 DIAGNOSIS — K5901 Slow transit constipation: Secondary | ICD-10-CM | POA: Diagnosis not present

## 2015-06-18 DIAGNOSIS — I739 Peripheral vascular disease, unspecified: Secondary | ICD-10-CM | POA: Insufficient documentation

## 2015-06-18 DIAGNOSIS — Z87891 Personal history of nicotine dependence: Secondary | ICD-10-CM | POA: Diagnosis not present

## 2015-06-18 DIAGNOSIS — Z79899 Other long term (current) drug therapy: Secondary | ICD-10-CM | POA: Insufficient documentation

## 2015-06-18 DIAGNOSIS — I10 Essential (primary) hypertension: Secondary | ICD-10-CM | POA: Diagnosis not present

## 2015-06-18 LAB — CBC WITH DIFFERENTIAL/PLATELET
Basophils Absolute: 0 10*3/uL (ref 0–0.1)
Basophils Relative: 1 %
EOS ABS: 0.1 10*3/uL (ref 0–0.7)
EOS PCT: 2 %
HCT: 32.8 % — ABNORMAL LOW (ref 40.0–52.0)
Hemoglobin: 11.5 g/dL — ABNORMAL LOW (ref 13.0–18.0)
LYMPHS ABS: 0.6 10*3/uL — AB (ref 1.0–3.6)
Lymphocytes Relative: 12 %
MCH: 29.5 pg (ref 26.0–34.0)
MCHC: 34.9 g/dL (ref 32.0–36.0)
MCV: 84.5 fL (ref 80.0–100.0)
MONO ABS: 0.8 10*3/uL (ref 0.2–1.0)
MONOS PCT: 15 %
Neutro Abs: 3.8 10*3/uL (ref 1.4–6.5)
Neutrophils Relative %: 70 %
PLATELETS: 580 10*3/uL — AB (ref 150–440)
RBC: 3.88 MIL/uL — ABNORMAL LOW (ref 4.40–5.90)
RDW: 13 % (ref 11.5–14.5)
WBC: 5.3 10*3/uL (ref 3.8–10.6)

## 2015-06-18 LAB — BASIC METABOLIC PANEL
Anion gap: 7 (ref 5–15)
BUN: 8 mg/dL (ref 6–20)
CO2: 29 mmol/L (ref 22–32)
CREATININE: 0.71 mg/dL (ref 0.61–1.24)
Calcium: 9.1 mg/dL (ref 8.9–10.3)
Chloride: 88 mmol/L — ABNORMAL LOW (ref 101–111)
GFR calc Af Amer: >60 mL/min (ref >60)
Glucose, Bld: 112 mg/dL — ABNORMAL HIGH (ref 65–99)
Potassium: 3.8 mmol/L (ref 3.5–5.1)
SODIUM: 124 mmol/L — AB (ref 135–145)

## 2015-06-18 MED ORDER — SODIUM CHLORIDE 0.9 % IV BOLUS (SEPSIS)
500.0000 mL | Freq: Once | INTRAVENOUS | Status: AC
  Administered 2015-06-18: 500 mL via INTRAVENOUS

## 2015-06-18 MED ORDER — POLYETHYLENE GLYCOL 3350 17 G PO PACK: 17.0000 g | PACK | Freq: Every day | ORAL | Status: DC

## 2015-06-18 MED ORDER — KETOROLAC TROMETHAMINE 30 MG/ML IJ SOLN
15.0000 mg | Freq: Once | INTRAMUSCULAR | Status: AC
  Administered 2015-06-18: 15 mg via INTRAVENOUS
  Filled 2015-06-18: qty 1

## 2015-06-18 NOTE — ED Notes (Signed)
Patient transported to X-ray 

## 2015-06-18 NOTE — ED Notes (Signed)
Pt to ed via ems from home with reports of constipation for three weeks. Pt post lobectomy on the left side three weeks ago at [place]. Pt with hx of lung ca.  Pt a/ox3 on arrival to ed with no acute distress noted.
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
## 2015-06-19 ENCOUNTER — Encounter: Payer: Self-pay | Admitting: Emergency Medicine

## 2015-06-19 ENCOUNTER — Telehealth: Payer: Self-pay | Admitting: *Deleted

## 2015-06-19 ENCOUNTER — Emergency Department
Admission: EM | Admit: 2015-06-19 | Discharge: 2015-06-19 | Disposition: A | Discharge status: Home / Self Care | Payer: Medicare Other | Attending: Emergency Medicine | Admitting: Emergency Medicine

## 2015-06-19 DIAGNOSIS — K5900 Constipation, unspecified: Secondary | ICD-10-CM

## 2015-06-19 DIAGNOSIS — Z79899 Other long term (current) drug therapy: Secondary | ICD-10-CM | POA: Insufficient documentation

## 2015-06-19 DIAGNOSIS — Z7982 Long term (current) use of aspirin: Secondary | ICD-10-CM | POA: Insufficient documentation

## 2015-06-19 DIAGNOSIS — J449 Chronic obstructive pulmonary disease, unspecified: Secondary | ICD-10-CM | POA: Insufficient documentation

## 2015-06-19 DIAGNOSIS — C3490 Malignant neoplasm of unspecified part of unspecified bronchus or lung: Secondary | ICD-10-CM | POA: Diagnosis not present

## 2015-06-19 DIAGNOSIS — I10 Essential (primary) hypertension: Secondary | ICD-10-CM | POA: Diagnosis not present

## 2015-06-19 DIAGNOSIS — Z87891 Personal history of nicotine dependence: Secondary | ICD-10-CM | POA: Insufficient documentation

## 2015-06-19 DIAGNOSIS — I739 Peripheral vascular disease, unspecified: Secondary | ICD-10-CM | POA: Insufficient documentation

## 2015-06-19 DIAGNOSIS — K5641 Fecal impaction: Secondary | ICD-10-CM

## 2015-06-19 NOTE — ED Notes (Signed)
Ambulated to bedside toilet. Feels like he may have a bowel movement.

## 2015-06-19 NOTE — ED Notes (Signed)
Pt reports continued constipation; was seen here yesterday for the same and given enema. Pt rpeorts he has had frequent small loose stools but still feels constipated. Pt with recent lobectomy on 06/05/15.

## 2015-06-19 NOTE — ED Notes (Signed)
Soap suds enema given to pt at this time, pt tolerated well, NAD noted.

## 2015-06-19 NOTE — ED Notes (Signed)
EDP at bedside, pt states was seen in ED yesterday for constipation, arrives today stating he still feels constipated, pt awake and alert in no acute distress
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
## 2015-06-19 NOTE — ED Notes (Signed)
Pt called out to RN, stating successful BM, and "feeling much better" Pt in NAD noted.

## 2015-06-19 NOTE — Discharge Instructions (Signed)
Constipation, Adult Constipation is when a person:  Poops (has a bowel movement) less than 3 times a week.  Has a hard time pooping.  Has poop that is dry, hard, or bigger than normal. HOME CARE   Eat foods with a lot of fiber in them. This includes fruits, vegetables, beans, and whole grains such as brown rice.  Avoid fatty foods and foods with a lot of sugar. This includes french fries, hamburgers, cookies, candy, and soda.  If you are not getting enough fiber from food, take products with added fiber in them (supplements).  Drink enough fluid to keep your pee (urine) clear or pale yellow.  Exercise on a regular basis, or as told by your doctor.  Go to the restroom when you feel like you need to poop. Do not hold it.  Only take medicine as told by your doctor. Do not take medicines that help you poop (laxatives) without talking to your doctor first. GET HELP RIGHT AWAY IF:   You have bright red blood in your poop (stool).  Your constipation lasts more than 4 days or gets worse.  You have belly (abdominal) or butt (rectal) pain.  You have thin poop (as thin as a pencil).  You lose weight, and it cannot be explained. MAKE SURE YOU:   Understand these instructions.  Will watch your condition.  Will get help right away if you are not doing well or get worse.   This information is not intended to replace advice given to you by your health care provider. Make sure you discuss any questions you have with your health care provider.   Document Released: 07/07/2007 Document Revised: 02/08/2014 Document Reviewed: 10/30/2012 Elsevier Interactive Patient Education 2016 [employer] Inc[employer]  

## 2015-06-19 NOTE — Telephone Encounter (Signed)
Sodium of 124 and not eating and not able to keep in po's, agree with reeevaluation in ER.

## 2015-06-19 NOTE — ED Notes (Signed)
MD Gayle at bedside. 

## 2015-06-19 NOTE — ED Notes (Signed)
MD Kinner at bedside  

## 2015-06-19 NOTE — Telephone Encounter (Signed)
Patients daughter stated that pt.was seen in the emergency room on yesterday, he had a bowel blockage, and they removed some bowel. He was sent home, and now is very uncomfortable. His daughter requested advise regarding if he should return to the emergency room, or if he can drink warm tea to help move his bowel.  Daughter : Shirlean Mylar [phone]

## 2015-06-19 NOTE — Telephone Encounter (Signed)
I reviewed his ER note.  Looks like they had to in/out cath he was retaining 511m of urine and xray showed large amounts of stool in bowel.  They disimpacted and got some and then gave enema and got more. Patient was discharge since then has only urinated a small amount per the daughter.  He feels full still.  Unable to hold fluids down as he feels so full.  I advised that he may need to go back to the ED to have them monitor and she agreed, just wants your input also.

## 2015-06-19 NOTE — Telephone Encounter (Signed)
Spoke with Shirlean Mylar, she is taking him now. Thanks

## 2015-06-19 NOTE — ED Notes (Signed)
MD Kinner at bedside disimpacting patient at this time, pt tolerating well, NAD noted.

## 2015-06-19 NOTE — ED Provider Notes (Signed)
-----------------------------------------   4:54 PM on 06/19/2015 -----------------------------------------  Patient reports that he has had a large bowel movement after enema. He reports that he feels much better at this time and has no complaints. Discussed return precautions,  need for close PCP follow-up and he is comfortable with the discharge plan. DC home.  Joanne Gavel, MD 06/19/15 [phone]

## 2015-06-20 NOTE — Telephone Encounter (Signed)
The patient was needing an ER follow up appointment with Dr. Nicki Reaper. I put him on her schedule at 11:30 on 5.24.17. If he is needing a sooner appointment please advise.

## 2015-06-20 NOTE — Telephone Encounter (Signed)
Spoke  With the patient, he is taking miralax now, he is feeling better, BM today, trying to drink as much as possible, had 4 bottle of water today already.  I advised to continue the miralax for now. He will see you on the 24th, or call if he needs anything sooner. thanks

## 2015-06-20 NOTE — Telephone Encounter (Signed)
Please make sure he gets on the schedule at this time.  Notify him I am out of office today and beginning of next week.  If he needs to be seen earlier, acute care.  Make sure eating and drinking.  If not, needs sodium rechecked.  Again can be seen at acute care and they can draw.

## 2015-06-20 NOTE — Telephone Encounter (Signed)
Please advise if you are okay with appt slot, thanks

## 2015-06-23 ENCOUNTER — Other Ambulatory Visit: Payer: Self-pay | Admitting: Internal Medicine

## 2015-06-23 DIAGNOSIS — E871 Hypo-osmolality and hyponatremia: Secondary | ICD-10-CM

## 2015-06-23 DIAGNOSIS — D649 Anemia, unspecified: Secondary | ICD-10-CM

## 2015-06-23 NOTE — Telephone Encounter (Signed)
Please call and check on pt today.  Also, please make sure he is eating.  Given sodium low in ER, needs to be rechecked.  Have him come in for non fasting labs today if possible.  I will place the order.

## 2015-06-23 NOTE — Progress Notes (Signed)
Order placed for f/u labs.  

## 2015-06-23 NOTE — Telephone Encounter (Signed)
Spoke with Patient.  He is feeling better today, he has been eating alittle bit better.  Drinking fluids, still taking the miralax once a day, small BM.  He is unable to return today to get labs checked due to a ride and needing someone to carry his Oxygen and all.  Scheduled for tomorrow for the repeat labs.

## 2015-06-24 ENCOUNTER — Other Ambulatory Visit (INDEPENDENT_AMBULATORY_CARE_PROVIDER_SITE_OTHER): Payer: Medicare Other

## 2015-06-24 DIAGNOSIS — D649 Anemia, unspecified: Secondary | ICD-10-CM | POA: Diagnosis not present

## 2015-06-24 DIAGNOSIS — E871 Hypo-osmolality and hyponatremia: Secondary | ICD-10-CM | POA: Diagnosis not present

## 2015-06-24 LAB — CBC WITH DIFFERENTIAL/PLATELET
BASOS ABS: 0 10*3/uL (ref 0.0–0.1)
Basophils Relative: 0.4 % (ref 0.0–3.0)
Eosinophils Absolute: 0.1 10*3/uL (ref 0.0–0.7)
Eosinophils Relative: 2.2 % (ref 0.0–5.0)
HCT: 32.9 % — ABNORMAL LOW (ref 39.0–52.0)
Hemoglobin: 11 g/dL — ABNORMAL LOW (ref 13.0–17.0)
LYMPHS ABS: 0.9 10*3/uL (ref 0.7–4.0)
Lymphocytes Relative: 24.2 % (ref 12.0–46.0)
MCHC: 33.5 g/dL (ref 30.0–36.0)
MCV: 86 fl (ref 78.0–100.0)
MONO ABS: 0.6 10*3/uL (ref 0.1–1.0)
MONOS PCT: 16.4 % — AB (ref 3.0–12.0)
NEUTROS ABS: 2.1 10*3/uL (ref 1.4–7.7)
NEUTROS PCT: 56.8 % (ref 43.0–77.0)
PLATELETS: 649 10*3/uL — AB (ref 150.0–400.0)
RBC: 3.82 Mil/uL — AB (ref 4.22–5.81)
RDW: 13.3 % (ref 11.5–15.5)
WBC: 3.7 10*3/uL — ABNORMAL LOW (ref 4.0–10.5)

## 2015-06-24 LAB — BASIC METABOLIC PANEL
BUN: 9 mg/dL (ref 6–23)
CO2: 30 meq/L (ref 19–32)
Calcium: 8.9 mg/dL (ref 8.4–10.5)
Chloride: 92 mEq/L — ABNORMAL LOW (ref 96–112)
Creatinine, Ser: 0.74 mg/dL (ref 0.40–1.50)
GFR: 132.9 mL/min (ref >60.00)
GLUCOSE: 119 mg/dL — AB (ref 70–99)
POTASSIUM: 4.8 meq/L (ref 3.5–5.1)
SODIUM: 126 meq/L — AB (ref 135–145)

## 2015-06-25 ENCOUNTER — Encounter: Payer: Self-pay | Admitting: Internal Medicine

## 2015-06-25 ENCOUNTER — Ambulatory Visit (INDEPENDENT_AMBULATORY_CARE_PROVIDER_SITE_OTHER): Payer: Medicare Other | Admitting: Internal Medicine

## 2015-06-25 VITALS — BP 120/78 | HR 85 | Temp 97.4°F | Resp 18 | Wt 164.6 lb

## 2015-06-25 DIAGNOSIS — I10 Essential (primary) hypertension: Secondary | ICD-10-CM

## 2015-06-25 DIAGNOSIS — E871 Hypo-osmolality and hyponatremia: Secondary | ICD-10-CM

## 2015-06-25 DIAGNOSIS — D649 Anemia, unspecified: Secondary | ICD-10-CM

## 2015-06-25 DIAGNOSIS — E7800 Pure hypercholesterolemia, unspecified: Secondary | ICD-10-CM | POA: Diagnosis not present

## 2015-06-25 DIAGNOSIS — C3412 Malignant neoplasm of upper lobe, left bronchus or lung: Secondary | ICD-10-CM

## 2015-06-25 NOTE — Patient Instructions (Signed)
Stop welchol and stop HCTZ (hydrochlorothiazide).
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
## 2015-06-25 NOTE — Progress Notes (Signed)
Pre visit review using our clinic review tool, if applicable. No additional management support is needed unless otherwise documented below in the visit note. 

## 2015-06-26 ENCOUNTER — Other Ambulatory Visit (INDEPENDENT_AMBULATORY_CARE_PROVIDER_SITE_OTHER): Payer: Medicare Other

## 2015-06-26 DIAGNOSIS — E871 Hypo-osmolality and hyponatremia: Secondary | ICD-10-CM | POA: Diagnosis not present

## 2015-06-26 DIAGNOSIS — D649 Anemia, unspecified: Secondary | ICD-10-CM | POA: Diagnosis not present

## 2015-06-26 LAB — CBC WITH DIFFERENTIAL/PLATELET
BASOS PCT: 0.6 % (ref 0.0–3.0)
Basophils Absolute: 0 10*3/uL (ref 0.0–0.1)
EOS ABS: 0.1 10*3/uL (ref 0.0–0.7)
Eosinophils Relative: 2.8 % (ref 0.0–5.0)
HCT: 33.1 % — ABNORMAL LOW (ref 39.0–52.0)
Hemoglobin: 11.1 g/dL — ABNORMAL LOW (ref 13.0–17.0)
Lymphocytes Relative: 24.7 % (ref 12.0–46.0)
Lymphs Abs: 0.9 10*3/uL (ref 0.7–4.0)
MCHC: 33.6 g/dL (ref 30.0–36.0)
MCV: 86.4 fl (ref 78.0–100.0)
MONO ABS: 0.8 10*3/uL (ref 0.1–1.0)
Monocytes Relative: 20.5 % — ABNORMAL HIGH (ref 3.0–12.0)
NEUTROS ABS: 1.9 10*3/uL (ref 1.4–7.7)
Neutrophils Relative %: 51.4 % (ref 43.0–77.0)
PLATELETS: 628 10*3/uL — AB (ref 150.0–400.0)
RBC: 3.83 Mil/uL — ABNORMAL LOW (ref 4.22–5.81)
RDW: 12.8 % (ref 11.5–15.5)
WBC: 3.7 10*3/uL — AB (ref 4.0–10.5)

## 2015-06-26 LAB — FERRITIN: FERRITIN: 169.5 ng/mL (ref 22.0–322.0)

## 2015-06-26 LAB — BASIC METABOLIC PANEL
BUN: 8 mg/dL (ref 6–23)
CALCIUM: 8.8 mg/dL (ref 8.4–10.5)
CHLORIDE: 94 meq/L — AB (ref 96–112)
CO2: 29 meq/L (ref 19–32)
CREATININE: 0.74 mg/dL (ref 0.40–1.50)
GFR: 132.9 mL/min (ref >60.00)
Glucose, Bld: 90 mg/dL (ref 70–99)
Potassium: 4.7 mEq/L (ref 3.5–5.1)
Sodium: 126 mEq/L — ABNORMAL LOW (ref 135–145)

## 2015-06-26 LAB — VITAMIN B12: VITAMIN B 12: 652 pg/mL (ref 211–911)

## 2015-06-27 ENCOUNTER — Other Ambulatory Visit: Payer: Self-pay | Admitting: Internal Medicine

## 2015-06-27 DIAGNOSIS — E871 Hypo-osmolality and hyponatremia: Secondary | ICD-10-CM

## 2015-06-27 NOTE — Progress Notes (Signed)
Order placed for f/u sodium.  ?

## 2015-06-28 ENCOUNTER — Encounter: Payer: Self-pay | Admitting: Internal Medicine

## 2015-06-28 NOTE — Assessment & Plan Note (Signed)
S/p lobectomy.  Doing well.  Bowels moving better now.  Breathing better.  Follow.  Continue f/u at [facility].

## 2015-06-28 NOTE — Assessment & Plan Note (Signed)
Recheck sodium 126.  He is feeling better and eating better now.  Stop hctz.  Encourage increase food intake.  Recheck metabolic panel tomorrow.

## 2015-06-28 NOTE — Assessment & Plan Note (Signed)
hgb slightly decreased.  Recheck cbc to confirm stable.

## 2015-06-28 NOTE — Assessment & Plan Note (Signed)
Given problems with constipation, stop welchol for now.  Follow.

## 2015-06-28 NOTE — Assessment & Plan Note (Signed)
Blood pressure as outlined.  Stop hctz.  Follow pressures.  Recheck metabolic panel.

## 2015-06-30 ENCOUNTER — Encounter: Payer: Self-pay | Admitting: Internal Medicine

## 2015-06-30 DIAGNOSIS — I6529 Occlusion and stenosis of unspecified carotid artery: Secondary | ICD-10-CM | POA: Insufficient documentation

## 2015-07-01 ENCOUNTER — Other Ambulatory Visit (INDEPENDENT_AMBULATORY_CARE_PROVIDER_SITE_OTHER): Payer: Medicare Other

## 2015-07-01 DIAGNOSIS — E871 Hypo-osmolality and hyponatremia: Secondary | ICD-10-CM

## 2015-07-01 LAB — SODIUM: Sodium: 132 mEq/L — ABNORMAL LOW (ref 135–145)

## 2015-07-02 ENCOUNTER — Other Ambulatory Visit: Payer: Self-pay | Admitting: Internal Medicine

## 2015-07-02 DIAGNOSIS — E871 Hypo-osmolality and hyponatremia: Secondary | ICD-10-CM

## 2015-07-02 NOTE — Progress Notes (Signed)
Order placed for f/u met b.  

## 2015-07-07 ENCOUNTER — Telehealth: Payer: Self-pay | Admitting: Internal Medicine

## 2015-07-07 NOTE — Telephone Encounter (Signed)
Please advise if this is received, thanks

## 2015-07-07 NOTE — Telephone Encounter (Signed)
Please hold until seen. I will be in lab for the next 2 days

## 2015-07-07 NOTE — Telephone Encounter (Signed)
Form is not in box today.

## 2015-07-07 NOTE — Telephone Encounter (Signed)
Reine Just [phone] called from [place] in reference to pt oxygen qualification from [facility]. Form is being faxed in 06/05 which needs to be fax back to [phone]. Thank you!

## 2015-07-08 NOTE — Telephone Encounter (Signed)
I have not seen.  Please hold until receive.

## 2015-07-09 NOTE — Telephone Encounter (Signed)
Reine Just called back from [employer] and stated he will resubmit form today via fax. Thank you!

## 2015-07-16 ENCOUNTER — Other Ambulatory Visit (INDEPENDENT_AMBULATORY_CARE_PROVIDER_SITE_OTHER): Payer: Medicare Other

## 2015-07-16 DIAGNOSIS — E871 Hypo-osmolality and hyponatremia: Secondary | ICD-10-CM | POA: Diagnosis not present

## 2015-07-16 LAB — BASIC METABOLIC PANEL
BUN: 6 mg/dL (ref 6–23)
CALCIUM: 9.3 mg/dL (ref 8.4–10.5)
CO2: 29 mEq/L (ref 19–32)
Chloride: 97 mEq/L (ref 96–112)
Creatinine, Ser: 0.85 mg/dL (ref 0.40–1.50)
GFR: 113.24 mL/min (ref >60.00)
Glucose, Bld: 90 mg/dL (ref 70–99)
POTASSIUM: 3.9 meq/L (ref 3.5–5.1)
SODIUM: 133 meq/L — AB (ref 135–145)

## 2015-07-17 ENCOUNTER — Encounter: Payer: Self-pay | Admitting: *Deleted

## 2015-07-31 ENCOUNTER — Telehealth: Payer: Self-pay | Admitting: *Deleted

## 2015-07-31 DIAGNOSIS — Z760 Encounter for issue of repeat prescription: Secondary | ICD-10-CM

## 2015-07-31 NOTE — Telephone Encounter (Signed)
Please advise on first part, I can do the refills when completed, thanks

## 2015-07-31 NOTE — Telephone Encounter (Signed)
Patient questioned when should he start his welchol and hydrochlorothiazide again. He also wanted to know when would be a good time to have his colonostomy and mammagram  Pt will need a medication refill metoprolol and [facility] script 90 day supply

## 2015-08-01 MED ORDER — DILTIAZEM HCL ER COATED BEADS 120 MG PO CP24: 120.0000 mg | ORAL_CAPSULE | Freq: Every day | ORAL | Status: DC

## 2015-08-01 MED ORDER — METOPROLOL TARTRATE 50 MG PO TABS: 50.0000 mg | ORAL_TABLET | Freq: Two times a day (BID) | ORAL | Status: DC

## 2015-08-01 NOTE — Telephone Encounter (Signed)
Reports BP readings ranging 130/78.  Verbalized understanding to STOP/HOLD hctz.  No problems wit BM.  Normal consistency.  No constipation.  OK for reevaluation.  Metoprolol and diltiazen refilled.

## 2015-08-01 NOTE — Telephone Encounter (Signed)
Need to know how his blood pressure is doing.  If ok, would remain off hctz.  Also, need to know if he is still having problems with his bowels.  Regular bowel movments?  Any constipation?  Will hold on colonoscopy at this time.  Will reevaluate him at his next appt.  Just want him to fully recover.  Ok to refill medication.

## 2015-08-07 ENCOUNTER — Telehealth: Payer: Self-pay | Admitting: Internal Medicine

## 2015-08-07 NOTE — Telephone Encounter (Signed)
Please advise, thanks.

## 2015-08-07 NOTE — Telephone Encounter (Signed)
Pt called and wants to know about WELCHOL 625 MG tablet. He is not sure if he should take this medication or not. Pt is also is off his oxygen, does this have anything to do with his acid reflex coming back? Please call pt at [phone].

## 2015-08-08 NOTE — Telephone Encounter (Signed)
If having worsening reflux despite medication, needs GI f/u.  If only taking one nexium per day, can increase to bid.  I can also send in a rx for prescription nexium and see if will cover now.  If not and is already taking nexium twice a day, can add zantac '150mg'$  at night.  Let us know if persistent problems.

## 2015-08-08 NOTE — Telephone Encounter (Signed)
Spoke with the patient, he is only taking the Nexium once a day, so he wants to try to increase to twice and see if it helps.  He was instructed to call back early next week to let us know if it is helping, if it is I can start a PA for getting it for him.  He verbalized understanding and will call back.  thanks

## 2015-08-08 NOTE — Telephone Encounter (Signed)
Per last phone note, bowels doing better and no GI problems.  Ok to restart welchol.  Being off the oxygen should not worsen his acid reflux.  Need to know symptoms having now and what he is taking for acid reflux (and how often).

## 2015-08-08 NOTE — Telephone Encounter (Signed)
Spoke with the patient, he will restart the welchol.  Regarding the reflux.  He was on the Protonix twice a day and about a week ago it stopped helping.  States that the symptoms were similar to the past, biggest complaint was hoarseness.  He went to the pharmacy and bought the OTC Nexium 24 hour as in the past that helped.  He has been taking that instead of the protonix, but it isn't helping now that is why he asked about the oxygen.  That is the only other thing that has changed that is isn't using.

## 2015-08-19 NOTE — Telephone Encounter (Signed)
Due for a mammogram and he wanted to see if her can get that done, wants it done at [facility].  Is that okay for him to have this done as he had the surgery?   Has been taking the nexium 24- twice a day.  He is getting hoarse at lunch time daily still with the twice a day.  Wants to know what other options he has as the medications are all not working now. thanks

## 2015-08-19 NOTE — Telephone Encounter (Signed)
Pt called back stating that the Nexium is not working. Pt was taking it twice a day. Pt called needing a referral for a Mammo he stated.   Call pt @ [phone]. Thank you!

## 2015-08-20 NOTE — Telephone Encounter (Signed)
Spoke with Patient.  He will call the surgeon to get clearance and then talk to you about it at the Office visit.  He would like the referral to GI, thanks

## 2015-08-20 NOTE — Telephone Encounter (Signed)
Regarding the mammogram, he has an upcoming appt with me 09/08/15.  I had wanted to wait and see how he is doing, to schedule the appt.  Also, if he could call his surgeon and see if they are ok (from a surgical standpoint) with him having the mammogram.  Regarding the nexium, I can send in rx for '40mg'$  nexium and see if insurance will cover.  Also, he needs a f/u appt with GI given persistent symptoms despite medications.  If agreeable to both, let me know.

## 2015-08-21 ENCOUNTER — Other Ambulatory Visit: Payer: Self-pay | Admitting: Internal Medicine

## 2015-08-21 DIAGNOSIS — K219 Gastro-esophageal reflux disease without esophagitis: Secondary | ICD-10-CM

## 2015-08-21 DIAGNOSIS — R490 Dysphonia: Secondary | ICD-10-CM

## 2015-08-21 NOTE — Progress Notes (Signed)
Order placed for GI referral.   

## 2015-08-21 NOTE — Telephone Encounter (Signed)
Noted thanks °

## 2015-08-21 NOTE — Telephone Encounter (Signed)
Order placed for GI referral.  Someone should be contacting him with appt date and time.

## 2015-09-08 ENCOUNTER — Encounter: Payer: Self-pay | Admitting: Internal Medicine

## 2015-09-08 ENCOUNTER — Ambulatory Visit (INDEPENDENT_AMBULATORY_CARE_PROVIDER_SITE_OTHER): Payer: Medicare Other | Admitting: Internal Medicine

## 2015-09-08 DIAGNOSIS — C3412 Malignant neoplasm of upper lobe, left bronchus or lung: Secondary | ICD-10-CM

## 2015-09-08 DIAGNOSIS — D72819 Decreased white blood cell count, unspecified: Secondary | ICD-10-CM

## 2015-09-08 DIAGNOSIS — E7800 Pure hypercholesterolemia, unspecified: Secondary | ICD-10-CM | POA: Diagnosis not present

## 2015-09-08 DIAGNOSIS — D649 Anemia, unspecified: Secondary | ICD-10-CM

## 2015-09-08 DIAGNOSIS — N62 Hypertrophy of breast: Secondary | ICD-10-CM

## 2015-09-08 DIAGNOSIS — I739 Peripheral vascular disease, unspecified: Secondary | ICD-10-CM

## 2015-09-08 DIAGNOSIS — I10 Essential (primary) hypertension: Secondary | ICD-10-CM

## 2015-09-08 DIAGNOSIS — K219 Gastro-esophageal reflux disease without esophagitis: Secondary | ICD-10-CM

## 2015-09-08 DIAGNOSIS — E871 Hypo-osmolality and hyponatremia: Secondary | ICD-10-CM

## 2015-09-08 DIAGNOSIS — R972 Elevated prostate specific antigen [PSA]: Secondary | ICD-10-CM

## 2015-09-08 NOTE — Progress Notes (Signed)
Pre visit review using our clinic review tool, if applicable. No additional management support is needed unless otherwise documented below in the visit note. 

## 2015-09-08 NOTE — Patient Instructions (Signed)
Take 12.'5mg'$  HCTZ per day.   If you have 25 mg tablets, take 1/2 tablet per day.
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
## 2015-09-09 ENCOUNTER — Encounter: Payer: Self-pay | Admitting: Internal Medicine

## 2015-09-09 NOTE — Assessment & Plan Note (Signed)
Followed by Dr Jacqlyn Larsen.  Just evaluated.  See note.

## 2015-09-09 NOTE — Assessment & Plan Note (Addendum)
S/p lobectomy as outlined.  Continue f/u with oncology.  Due f/u CT chest 6 months after last visit.  See note.  There was mention in note about being checked for sleep apnea.  He does snore.  Denies daytime somnolence.  Wakes feeling rested.  Wants to hold at this time.  Will follow.

## 2015-09-09 NOTE — Assessment & Plan Note (Signed)
Saw Dr Pat Patrick.  S/p bilateral subcutaneous mastectomies.  Has seroma.  States due f/u mammogram.  Will contact Dr Rolin Barry office.

## 2015-09-09 NOTE — Assessment & Plan Note (Signed)
No pain with exercise or ambulation.  Continue daily aspirin.  Continue risk factor modification.

## 2015-09-09 NOTE — Assessment & Plan Note (Signed)
Blood pressure has been a little elevated.  Will restart hctz 12.'5mg'$  q day.  Follow pressures.  Follow metabolic panel.

## 2015-09-09 NOTE — Assessment & Plan Note (Signed)
Sodium improved last check.  Follow.

## 2015-09-09 NOTE — Assessment & Plan Note (Signed)
hgb decreased.  Recent surgery.  Recheck cbc.

## 2015-09-09 NOTE — Assessment & Plan Note (Signed)
He is back on welchol.  Follow lipid panel.

## 2015-09-09 NOTE — Assessment & Plan Note (Signed)
Acid reflux flare recently.  On medication.  Is better now.  Has f/u with GI.

## 2015-09-09 NOTE — Assessment & Plan Note (Signed)
White count has been stable.  Follow cbc.

## 2015-09-10 ENCOUNTER — Telehealth: Payer: Self-pay

## 2015-09-10 ENCOUNTER — Telehealth: Payer: Self-pay | Admitting: Internal Medicine

## 2015-09-10 DIAGNOSIS — N62 Hypertrophy of breast: Secondary | ICD-10-CM

## 2015-09-10 NOTE — Telephone Encounter (Signed)
Spoke with Dr.Charlene Scott at this time regarding patient Adrian Cohen. He was seen by Dr.Ely for Gynecomastia on 09-20-14 and follow up is as needed basis.  Dr.Scottsaid she was going to arrange follow up appointment due to other things he had going on at this time.  She will be sending a referral and making an appointment.

## 2015-09-10 NOTE — Telephone Encounter (Signed)
Spoke with pt and explained that I called Dr Rolin Barry office.  Discussed with Freda Munro.  Recommends f/u with one of his partners for evaluation and question of need for any further testing or scanning.  Order placed for referral.

## 2015-09-22 ENCOUNTER — Ambulatory Visit: Payer: Medicare Other | Admitting: General Surgery

## 2015-09-23 ENCOUNTER — Telehealth: Payer: Self-pay

## 2015-09-23 ENCOUNTER — Encounter: Payer: Self-pay | Admitting: General Surgery

## 2015-09-23 ENCOUNTER — Ambulatory Visit (INDEPENDENT_AMBULATORY_CARE_PROVIDER_SITE_OTHER): Payer: Medicare Other | Admitting: General Surgery

## 2015-09-23 ENCOUNTER — Telehealth: Payer: Self-pay | Admitting: *Deleted

## 2015-09-23 VITALS — BP 165/86 | HR 73 | Temp 97.7°F | Ht 69.0 in | Wt 163.0 lb

## 2015-09-23 DIAGNOSIS — N62 Hypertrophy of breast: Secondary | ICD-10-CM

## 2015-09-23 DIAGNOSIS — Z87898 Personal history of other specified conditions: Secondary | ICD-10-CM | POA: Diagnosis not present

## 2015-09-23 NOTE — Telephone Encounter (Signed)
Duplicate.  See attached.   

## 2015-09-23 NOTE — Telephone Encounter (Signed)
Please advise 

## 2015-09-23 NOTE — Patient Instructions (Signed)
I will call and schedule your mammogram today. I will call you later today with the date and time. Please call our office if you have any questions or concerns.

## 2015-09-23 NOTE — Telephone Encounter (Signed)
His blood pressure on recheck was ok.  Have him continue to monitor his blood pressure.  Would need more readings before determining if medication needed to be adjusted.  If problems, can schedule a f/u appt with me.  Thanks.

## 2015-09-23 NOTE — Addendum Note (Signed)
Addended by: Celene Kras on: 09/23/2015 11:53 AM   Modules accepted: Orders

## 2015-09-23 NOTE — Telephone Encounter (Signed)
Patient was seen a another office today, he had a blood pressure reading of 162/82, once he returned home he checked his blood pressure with his home meter with a reading of 126-70. He questioned if he should have a medication adjustment. Pt contact [phone]
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
## 2015-09-23 NOTE — Telephone Encounter (Signed)
Patient notified of mammogram appointment [facility] in [place] on 10-03-15 @ 1:00pm. Patient verbalized understanding.

## 2015-09-24 NOTE — Telephone Encounter (Signed)
Patient has been informed and patient has appointment with Dr. Nicki Reaper this Friday.  Patient understood to have more screenings to help with determining medication adjustment.

## 2015-09-26 ENCOUNTER — Encounter: Payer: Self-pay | Admitting: Internal Medicine

## 2015-09-26 ENCOUNTER — Ambulatory Visit (INDEPENDENT_AMBULATORY_CARE_PROVIDER_SITE_OTHER): Payer: Medicare Other | Admitting: Internal Medicine

## 2015-09-26 VITALS — BP 130/78 | HR 80 | Temp 97.7°F | Resp 18 | Ht 69.0 in | Wt 163.5 lb

## 2015-09-26 DIAGNOSIS — D72819 Decreased white blood cell count, unspecified: Secondary | ICD-10-CM

## 2015-09-26 DIAGNOSIS — Z23 Encounter for immunization: Secondary | ICD-10-CM

## 2015-09-26 DIAGNOSIS — E871 Hypo-osmolality and hyponatremia: Secondary | ICD-10-CM | POA: Diagnosis not present

## 2015-09-26 DIAGNOSIS — R0989 Other specified symptoms and signs involving the circulatory and respiratory systems: Secondary | ICD-10-CM | POA: Diagnosis not present

## 2015-09-26 DIAGNOSIS — D649 Anemia, unspecified: Secondary | ICD-10-CM

## 2015-09-26 DIAGNOSIS — I739 Peripheral vascular disease, unspecified: Secondary | ICD-10-CM | POA: Diagnosis not present

## 2015-09-26 DIAGNOSIS — I10 Essential (primary) hypertension: Secondary | ICD-10-CM

## 2015-09-26 DIAGNOSIS — N62 Hypertrophy of breast: Secondary | ICD-10-CM

## 2015-09-26 DIAGNOSIS — E7800 Pure hypercholesterolemia, unspecified: Secondary | ICD-10-CM

## 2015-09-26 DIAGNOSIS — Z86010 Personal history of colonic polyps: Secondary | ICD-10-CM

## 2015-09-26 DIAGNOSIS — C3412 Malignant neoplasm of upper lobe, left bronchus or lung: Secondary | ICD-10-CM

## 2015-09-26 LAB — BASIC METABOLIC PANEL
BUN: 6 mg/dL (ref 6–23)
CHLORIDE: 97 meq/L (ref 96–112)
CO2: 30 meq/L (ref 19–32)
CREATININE: 0.84 mg/dL (ref 0.40–1.50)
Calcium: 8.8 mg/dL (ref 8.4–10.5)
GFR: 114.73 mL/min (ref >60.00)
Glucose, Bld: 83 mg/dL (ref 70–99)
POTASSIUM: 3.8 meq/L (ref 3.5–5.1)
Sodium: 133 mEq/L — ABNORMAL LOW (ref 135–145)

## 2015-09-26 NOTE — Progress Notes (Signed)
Pre-visit discussion using our clinic review tool. No additional management support is needed unless otherwise documented below in the visit note.  

## 2015-09-26 NOTE — Patient Instructions (Signed)
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
## 2015-09-28 ENCOUNTER — Encounter: Payer: Self-pay | Admitting: Internal Medicine

## 2015-09-28 NOTE — Assessment & Plan Note (Signed)
S/p bilateral subcutaneous mastectomies.  Seroma.  Saw surgery in f/u.  Planning for f/u mammogram.  If ok, will be released.

## 2015-09-28 NOTE — Assessment & Plan Note (Signed)
Follow cbc.  

## 2015-09-28 NOTE — Assessment & Plan Note (Signed)
Saw GI.  Planning for f/u colonoscopy.

## 2015-09-28 NOTE — Assessment & Plan Note (Signed)
Blood pressure under good control.  Continue same medication regimen.  Follow pressures.  Follow metabolic panel.   

## 2015-09-28 NOTE — Assessment & Plan Note (Signed)
S/p lobectomy - adenocarcinoma.  Continue f/u with oncology.  Breathing doing well.  Due f/u chest CT 6 months after last visit.  Continue f/u with pulmonary.

## 2015-09-28 NOTE — Assessment & Plan Note (Signed)
Carotid ultrasound 05/2015 - 50-69% right internal carotid and <50% left internal carotid.  Discussed with him today.  Will need f/u with vascular.  Will schedule at next visit.  Continue risk factor modification.   Continue daily aspirin.

## 2015-09-28 NOTE — Assessment & Plan Note (Signed)
hgb last checked 11.1.  Follow cbc.

## 2015-09-28 NOTE — Assessment & Plan Note (Signed)
Sodium slightly decreased.  Recheck today since back on hctz.

## 2015-09-28 NOTE — Assessment & Plan Note (Signed)
Unable to take statin medication.  On welchol.  Follow lipid panel.

## 2015-09-28 NOTE — Assessment & Plan Note (Signed)
Continue daily aspirin.  Continue risk factor modification.

## 2015-09-29 ENCOUNTER — Telehealth: Payer: Self-pay

## 2015-09-29 NOTE — Telephone Encounter (Signed)
-----   Message from Bevelyn Ngo, RN sent at 09/29/2015 11:05 AM EDT -----   ----- Message ----- From: Einar Pheasant, MD Sent: 09/28/2015   8:55 PM To: Leeanne Rio, CMA  Notify pt that his sodium is stable.  Potassium and kidney function tests are wnl.

## 2015-09-29 NOTE — Telephone Encounter (Signed)
Advised pt of lab results. Pt verbally acknowledges understanding. Emily Drozdowski, CMA   

## 2015-10-03 ENCOUNTER — Other Ambulatory Visit: Payer: Medicare Other

## 2015-10-03 ENCOUNTER — Ambulatory Visit: Admission: RE | Admit: 2015-10-03 | Payer: Medicare Other | Source: Ambulatory Visit

## 2015-10-03 ENCOUNTER — Telehealth: Payer: Self-pay

## 2015-10-03 NOTE — Telephone Encounter (Signed)
Patient forgot his appointment today @ [facility] to have his Mammogram and Ultrasound done.  I have placed a call to [facility] at this time to reschedule this appointment.  Patient also has follow up appointment on 10/13/15 @ 3:45 pm Dr.Pabon of which we discussed that if we cannot get his Mammogram and ultrasound scheduled and have the results back by this time that we will need to reschedule this as well.  I will call patient to let him know when I get this rescheduled. Patient verbalized understanding.  Call placed to [facility] at this time to reschedule appointment for Mammogram and Ultrasound. Left message for them to return my call.

## 2015-10-07 NOTE — Telephone Encounter (Signed)
Appointment reminders mailed at this time to patient.   [facility]- 10/22/15 @ 2:40 pm   Dr.Woodham - 10/27/15 2 9:15

## 2015-10-07 NOTE — Telephone Encounter (Signed)
Mailed appointment reminders to patient for Mammogram and Ultrasound and follow up with Dr.Woodham at this time.

## 2015-10-07 NOTE — Telephone Encounter (Signed)
-----   Message from Celene Kras, [place] sent at 10/03/2015  3:16 PM EDT ----- Regarding: MMG and [place]  1. Schedule MMG and [place] with [facility]. Patient had an appointment on 10/03/15 and forgot.   2. F/U appt to discuss MMG and [place] Dr.Pabon 10/13/15 @ 3:45 pm [place] [facility].

## 2015-10-07 NOTE — Telephone Encounter (Signed)
Spoke with Roselyn Reef @ [facility] at this time and patients appointment is listed below.    LVM at this time for patient to call office. Mammogram appointment is on: September 20, @ 2:40 pm at [facility].

## 2015-10-08 ENCOUNTER — Other Ambulatory Visit (INDEPENDENT_AMBULATORY_CARE_PROVIDER_SITE_OTHER): Payer: Medicare Other

## 2015-10-08 DIAGNOSIS — I10 Essential (primary) hypertension: Secondary | ICD-10-CM | POA: Diagnosis not present

## 2015-10-08 DIAGNOSIS — E7800 Pure hypercholesterolemia, unspecified: Secondary | ICD-10-CM

## 2015-10-08 DIAGNOSIS — D649 Anemia, unspecified: Secondary | ICD-10-CM

## 2015-10-08 LAB — HEPATIC FUNCTION PANEL
ALK PHOS: 51 U/L (ref 39–117)
ALT: 11 U/L (ref 0–53)
AST: 16 U/L (ref 0–37)
Albumin: 4.1 g/dL (ref 3.5–5.2)
BILIRUBIN DIRECT: 0.1 mg/dL (ref 0.0–0.3)
Total Bilirubin: 0.4 mg/dL (ref 0.2–1.2)
Total Protein: 6.8 g/dL (ref 6.0–8.3)

## 2015-10-08 LAB — LIPID PANEL
CHOL/HDL RATIO: 3
Cholesterol: 232 mg/dL — ABNORMAL HIGH (ref 0–200)
HDL: 79.4 mg/dL (ref >39.00)
LDL Cholesterol: 140 mg/dL — ABNORMAL HIGH (ref 0–99)
NONHDL: 152.91
TRIGLYCERIDES: 64 mg/dL (ref 0.0–149.0)
VLDL: 12.8 mg/dL (ref 0.0–40.0)

## 2015-10-08 LAB — BASIC METABOLIC PANEL
BUN: 7 mg/dL (ref 6–23)
CHLORIDE: 98 meq/L (ref 96–112)
CO2: 34 meq/L — AB (ref 19–32)
CREATININE: 0.83 mg/dL (ref 0.40–1.50)
Calcium: 9 mg/dL (ref 8.4–10.5)
GFR: 116.32 mL/min (ref >60.00)
Glucose, Bld: 87 mg/dL (ref 70–99)
Potassium: 3.9 mEq/L (ref 3.5–5.1)
Sodium: 135 mEq/L (ref 135–145)

## 2015-10-08 LAB — CBC WITH DIFFERENTIAL/PLATELET
Basophils Relative: 0 % (ref 0.0–3.0)
EOS PCT: 2 % (ref 0.0–5.0)
HEMATOCRIT: 37.8 % — AB (ref 39.0–52.0)
Hemoglobin: 12.7 g/dL — ABNORMAL LOW (ref 13.0–17.0)
LYMPHS PCT: 38 % (ref 12.0–46.0)
MCHC: 33.6 g/dL (ref 30.0–36.0)
MCV: 83.5 fl (ref 78.0–100.0)
Monocytes Relative: 16 % — ABNORMAL HIGH (ref 3.0–12.0)
NEUTROS PCT: 48 % (ref 43.0–77.0)
PLATELETS: 302 10*3/uL (ref 150.0–400.0)
RBC: 4.53 Mil/uL (ref 4.22–5.81)
RDW: 14.5 % (ref 11.5–15.5)
WBC: 3.5 10*3/uL — AB (ref 4.0–10.5)

## 2015-10-09 ENCOUNTER — Encounter: Payer: Self-pay | Admitting: *Deleted

## 2015-10-11 ENCOUNTER — Other Ambulatory Visit: Payer: Self-pay | Admitting: Internal Medicine

## 2015-10-11 DIAGNOSIS — Z760 Encounter for issue of repeat prescription: Secondary | ICD-10-CM

## 2015-10-13 ENCOUNTER — Ambulatory Visit: Payer: Self-pay | Admitting: Surgery

## 2015-10-22 ENCOUNTER — Telehealth: Payer: Self-pay

## 2015-10-22 ENCOUNTER — Ambulatory Visit
Admission: RE | Admit: 2015-10-22 | Discharge: 2015-10-22 | Disposition: A | Discharge status: Home / Self Care | Payer: Medicare Other | Source: Ambulatory Visit | Attending: General Surgery | Admitting: General Surgery

## 2015-10-22 DIAGNOSIS — N62 Hypertrophy of breast: Secondary | ICD-10-CM | POA: Diagnosis present

## 2015-10-22 DIAGNOSIS — Z87898 Personal history of other specified conditions: Secondary | ICD-10-CM

## 2015-10-22 NOTE — Telephone Encounter (Signed)
Dr. Jetta Lout from [facility] called wanting to speak with a provider. She stated that patient was at her location for a mammogram that was ordered from Dr. Adonis Huguenin. She also stated that since patient already had a bilateral mastectomy 06/17/2014 by Dr. Pat Patrick and pathology done on 02/27/2015 through an ultrasound showing benign. Then she didn't agree on doing a mammogram since he has a history of gynecomastia. Dr. Jetta Lout wanted to speak with a provider to make sure. I then told Dr. Azalee Course and she spoke with Dr. Jetta Lout and they both agreed on just doing an ultrasound and no mammogram at this time.I will inform Dr. Adonis Huguenin about this decision.

## 2015-10-23 ENCOUNTER — Telehealth: Payer: Self-pay

## 2015-10-23 NOTE — Telephone Encounter (Signed)
Spoke with patient at this time to let him no the Ultrasound results- no evidence of malignancy. Also reminded patient of his follow up appointment with Dr.Woodham on 10/27/15.

## 2015-10-23 NOTE — Telephone Encounter (Signed)
LVM at this time to call office regarding Normal Ultrasound results and to remind him of his follow up appointment on 10/27/15 @ 9:30 with Dr.Woodham.

## 2015-10-27 ENCOUNTER — Ambulatory Visit (INDEPENDENT_AMBULATORY_CARE_PROVIDER_SITE_OTHER): Payer: Medicare Other | Admitting: General Surgery

## 2015-10-27 ENCOUNTER — Encounter: Payer: Self-pay | Admitting: General Surgery

## 2015-10-27 VITALS — BP 170/97 | HR 70 | Temp 97.9°F | Ht 69.0 in | Wt 165.0 lb

## 2015-10-27 DIAGNOSIS — Z87898 Personal history of other specified conditions: Secondary | ICD-10-CM | POA: Diagnosis not present
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
## 2015-10-27 NOTE — Patient Instructions (Signed)
Please call our office if you have any questions or concerns.  

## 2015-11-18 ENCOUNTER — Other Ambulatory Visit: Payer: Self-pay | Admitting: Internal Medicine

## 2015-11-20 ENCOUNTER — Ambulatory Visit (INDEPENDENT_AMBULATORY_CARE_PROVIDER_SITE_OTHER): Payer: Medicare Other | Admitting: Internal Medicine

## 2015-11-20 ENCOUNTER — Encounter: Payer: Self-pay | Admitting: Internal Medicine

## 2015-11-20 DIAGNOSIS — E7800 Pure hypercholesterolemia, unspecified: Secondary | ICD-10-CM

## 2015-11-20 DIAGNOSIS — C3412 Malignant neoplasm of upper lobe, left bronchus or lung: Secondary | ICD-10-CM

## 2015-11-20 DIAGNOSIS — I10 Essential (primary) hypertension: Secondary | ICD-10-CM

## 2015-11-20 DIAGNOSIS — E871 Hypo-osmolality and hyponatremia: Secondary | ICD-10-CM

## 2015-11-20 DIAGNOSIS — K2970 Gastritis, unspecified, without bleeding: Secondary | ICD-10-CM

## 2015-11-20 MED ORDER — QUINAPRIL HCL 20 MG PO TABS: 20.0000 mg | ORAL_TABLET | Freq: Every day | ORAL | 2 refills | Status: DC
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
## 2015-11-20 NOTE — Progress Notes (Signed)
Pre visit review using our clinic review tool, if applicable. No additional management support is needed unless otherwise documented below in the visit note. 

## 2015-11-22 ENCOUNTER — Encounter: Payer: Self-pay | Admitting: Internal Medicine

## 2015-11-22 NOTE — Assessment & Plan Note (Signed)
Previous EGD gastritis.   Just saw GI.  Planning for EGD.  On dexilant now.

## 2015-11-22 NOTE — Assessment & Plan Note (Signed)
Blood pressure elevated.  Restart accupril '20mg'$  q day.  Follow pressures.  Follow metabolic panel.

## 2015-11-22 NOTE — Assessment & Plan Note (Signed)
S/p lobectomy - adenocarcinoma.  Continues to f/u with oncology.  Breathing doing well.  Continue f/u with pulmonary.

## 2015-11-22 NOTE — Assessment & Plan Note (Signed)
Low cholesterol diet and exercise.  Intolerant to statin medication.  Continue welchol.  Follow.

## 2015-11-22 NOTE — Assessment & Plan Note (Signed)
Sodium stable.  Follow.

## 2015-12-01 ENCOUNTER — Telehealth: Payer: Self-pay | Admitting: Internal Medicine

## 2015-12-01 MED ORDER — QUINAPRIL HCL 40 MG PO TABS: 40.0000 mg | ORAL_TABLET | Freq: Every day | ORAL | 3 refills | Status: DC

## 2015-12-01 NOTE — Telephone Encounter (Signed)
Please advise 

## 2015-12-01 NOTE — Telephone Encounter (Signed)
Pt stated that his BP is still high on: 10/26 144/72 and 153/76 10/27 147/86 and 148/81 and 163/79 and 185/86 10/28 170/93 and 168/93 and 164/93 On medication quinapril (ACCUPRIL) 20 MG tablet Thank you!

## 2015-12-01 NOTE — Telephone Encounter (Signed)
Patient notified and  rx sent to pharmacy. He will continue to monitor

## 2015-12-01 NOTE — Telephone Encounter (Signed)
If blood pressure still elevated, go ahead and increase accupril to '40mg'$  q day.  Also will need to change on med list and send in new rx.  Thanks  Continue to monitor blood pressure and let us know if persistent problems.

## 2015-12-10 ENCOUNTER — Ambulatory Visit (INDEPENDENT_AMBULATORY_CARE_PROVIDER_SITE_OTHER): Payer: Medicare Other

## 2015-12-10 VITALS — BP 142/82 | HR 72 | Temp 97.7°F | Resp 12 | Ht 69.5 in | Wt 166.1 lb

## 2015-12-10 DIAGNOSIS — Z0000 Encounter for general adult medical examination without abnormal findings: Secondary | ICD-10-CM

## 2015-12-10 NOTE — Patient Instructions (Addendum)
Adrian Cohen , Thank you for taking time to come for your Medicare Wellness Visit. I appreciate your ongoing commitment to your health goals. Please review the following plan we discussed and let me know if I can assist you in the future.   CONTINUE RECORDING BLOOD PRESSURE READINGS.  FOLLOW UP WITH DR. Nicki Reaper AS NEEDED.  WE WILL CONTACT YOU REGARDING OVER THE COUNTER HAWTHORN BERRY '565mg'$  (CARDIOVASCULAR).  These are the goals we discussed: Goals    . Healthy Lifestyle          Stay hydrated and drink plenty of water. Stay active and continue exercise regimen. Low carb foods.  Lean meats, vegetables, fruits.       This is a list of the screening recommended for you and due dates:  Health Maintenance  Topic Date Due  . Mammogram  08/11/2014  . Tetanus Vaccine  12/10/2015*  . Colon Cancer Screening  07/12/2022  . Flu Shot  Completed  . Shingles Vaccine  Completed  . Pneumonia vaccines  Completed  *Topic was postponed. The date shown is not the original due date.    Health Maintenance, Male A healthy lifestyle and preventative care can promote health and wellness.  Maintain regular health, dental, and eye exams.  Eat a healthy diet. Foods like vegetables, fruits, whole grains, low-fat dairy products, and lean protein foods contain the nutrients you need and are low in calories. Decrease your intake of foods high in solid fats, added sugars, and salt. Get information about a proper diet from your health care provider, if necessary.  Regular physical exercise is one of the most important things you can do for your health. Most adults should get at least 150 minutes of moderate-intensity exercise (any activity that increases your heart rate and causes you to sweat) each week. In addition, most adults need muscle-strengthening exercises on 2 or more days a week.   Maintain a healthy weight. The body mass index (BMI) is a screening tool to identify possible weight problems. It provides  an estimate of body fat based on height and weight. Your health care provider can find your BMI and can help you achieve or maintain a healthy weight. For males 20 years and older:  A BMI below 18.5 is considered underweight.  A BMI of 18.5 to 24.9 is normal.  A BMI of 25 to 29.9 is considered overweight.  A BMI of 30 and above is considered obese.  Maintain normal blood lipids and cholesterol by exercising and minimizing your intake of saturated fat. Eat a balanced diet with plenty of fruits and vegetables. Blood tests for lipids and cholesterol should begin at age 50 and be repeated every 5 years. If your lipid or cholesterol levels are high, you are over age 8, or you are at high risk for heart disease, you may need your cholesterol levels checked more frequently.Ongoing high lipid and cholesterol levels should be treated with medicines if diet and exercise are not working.  If you smoke, find out from your health care provider how to quit. If you do not use tobacco, do not start.  Lung cancer screening is recommended for adults aged 49-80 years who are at high risk for developing lung cancer because of a history of smoking. A yearly low-dose CT scan of the lungs is recommended for people who have at least a 30-pack-year history of smoking and are current smokers or have quit within the past 15 years. A pack year of smoking is smoking an  average of 1 pack of cigarettes a day for 1 year (for example, a 30-pack-year history of smoking could mean smoking 1 pack a day for 30 years or 2 packs a day for 15 years). Yearly screening should continue until the smoker has stopped smoking for at least 15 years. Yearly screening should be stopped for people who develop a health problem that would prevent them from having lung cancer treatment.  If you choose to drink alcohol, do not have more than 2 drinks per day. One drink is considered to be 12 oz (360 mL) of beer, 5 oz (150 mL) of wine, or 1.5 oz (45 mL)  of liquor.  Avoid the use of street drugs. Do not share needles with anyone. Ask for help if you need support or instructions about stopping the use of drugs.  High blood pressure causes heart disease and increases the risk of stroke. High blood pressure is more likely to develop in:  People who have blood pressure in the end of the normal range (100-139/85-89 mm Hg).  People who are overweight or obese.  People who are African American.  If you are 86-56 years of age, have your blood pressure checked every 3-5 years. If you are 29 years of age or older, have your blood pressure checked every year. You should have your blood pressure measured twice--once when you are at a hospital or clinic, and once when you are not at a hospital or clinic. Record the average of the two measurements. To check your blood pressure when you are not at a hospital or clinic, you can use:  An automated blood pressure machine at a pharmacy.  A home blood pressure monitor.  If you are 21-23 years old, ask your health care provider if you should take aspirin to prevent heart disease.  Diabetes screening involves taking a blood sample to check your fasting blood sugar level. This should be done once every 3 years after age 55 if you are at a normal weight and without risk factors for diabetes. Testing should be considered at a younger age or be carried out more frequently if you are overweight and have at least 1 risk factor for diabetes.  Colorectal cancer can be detected and often prevented. Most routine colorectal cancer screening begins at the age of 53 and continues through age 40. However, your health care provider may recommend screening at an earlier age if you have risk factors for colon cancer. On a yearly basis, your health care provider may provide home test kits to check for hidden blood in the stool. A small camera at the end of a tube may be used to directly examine the colon (sigmoidoscopy or  colonoscopy) to detect the earliest forms of colorectal cancer. Talk to your health care provider about this at age 44 when routine screening begins. A direct exam of the colon should be repeated every 5-10 years through age 6, unless early forms of precancerous polyps or small growths are found.  People who are at an increased risk for hepatitis B should be screened for this virus. You are considered at high risk for hepatitis B if:  You were born in a country where hepatitis B occurs often. Talk with your health care provider about which countries are considered high risk.  Your parents were born in a high-risk country and you have not received a shot to protect against hepatitis B (hepatitis B vaccine).  You have HIV or AIDS.  You use needles  to inject street drugs.  You live with, or have sex with, someone who has hepatitis B.  You are a man who has sex with other men (MSM).  You get hemodialysis treatment.  You take certain medicines for conditions like cancer, organ transplantation, and autoimmune conditions.  Hepatitis C blood testing is recommended for all people born from [birth year] through 1964 and any individual with known risk factors for hepatitis C.  Healthy men should no longer receive prostate-specific antigen (PSA) blood tests as part of routine cancer screening. Talk to your health care provider about prostate cancer screening.  Testicular cancer screening is not recommended for adolescents or adult males who have no symptoms. Screening includes self-exam, a health care provider exam, and other screening tests. Consult with your health care provider about any symptoms you have or any concerns you have about testicular cancer.  Practice safe sex. Use condoms and avoid high-risk sexual practices to reduce the spread of sexually transmitted infections (STIs).  You should be screened for STIs, including gonorrhea and chlamydia if:  You are sexually active and are younger than  24 years.  You are older than 24 years, and your health care provider tells you that you are at risk for this type of infection.  Your sexual activity has changed since you were last screened, and you are at an increased risk for chlamydia or gonorrhea. Ask your health care provider if you are at risk.  If you are at risk of being infected with HIV, it is recommended that you take a prescription medicine daily to prevent HIV infection. This is called pre-exposure prophylaxis (PrEP). You are considered at risk if:  You are a man who has sex with other men (MSM).  You are a heterosexual man who is sexually active with multiple partners.  You take drugs by injection.  You are sexually active with a partner who has HIV.  Talk with your health care provider about whether you are at high risk of being infected with HIV. If you choose to begin PrEP, you should first be tested for HIV. You should then be tested every 3 months for as long as you are taking PrEP.  Use sunscreen. Apply sunscreen liberally and repeatedly throughout the day. You should seek shade when your shadow is shorter than you. Protect yourself by wearing long sleeves, pants, a wide-brimmed hat, and sunglasses year round whenever you are outdoors.  Tell your health care provider of new moles or changes in moles, especially if there is a change in shape or color. Also, tell your health care provider if a mole is larger than the size of a pencil eraser.  A one-time screening for abdominal aortic aneurysm (AAA) and surgical repair of large AAAs by ultrasound is recommended for men aged 40-75 years who are current or former smokers.  Stay current with your vaccines (immunizations).   This information is not intended to replace advice given to you by your health care provider. Make sure you discuss any questions you have with your health care provider.   Document Released: 07/17/2007 Document Revised: 02/08/2014 Document Reviewed:  06/15/2010 Elsevier Interactive Patient Education [employer].
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
## 2015-12-16 ENCOUNTER — Telehealth: Payer: Self-pay | Admitting: Internal Medicine

## 2015-12-16 NOTE — Telephone Encounter (Signed)
Pt called and stated that Dr. Nicki Reaper prescribed a new bp medication quinapril (ACCUPRIL) 40 MG tablet and stated that it is still high. He started the medication on 10/19 '20mg'$  and started the '40mg'$  on 10/30These are the readings.  11/8 - 140/72 135/71 167/111 166/88 11/9 - 135/73 149/79  11/10 - 154/76 164/83 11/11 138/73 174/82 168/85  11/12 159/75 146/87 158/76 11/13 - 172/90 183/89 174/90 11/14 - 153/86  Call pt @ [phone]

## 2015-12-16 NOTE — Telephone Encounter (Signed)
Patient notified

## 2015-12-16 NOTE — Telephone Encounter (Signed)
Pt called back returning your call. Thank you!  Call pt @ [phone]

## 2015-12-16 NOTE — Telephone Encounter (Signed)
Left message to return call 

## 2015-12-16 NOTE — Telephone Encounter (Signed)
Have him increase his hctz to one whole tablet per day (instead of 1/2 tablet) q day.  Keep his f/u appt this month

## 2015-12-16 NOTE — Telephone Encounter (Signed)
Please advise 

## 2015-12-23 ENCOUNTER — Ambulatory Visit (INDEPENDENT_AMBULATORY_CARE_PROVIDER_SITE_OTHER): Payer: Medicare Other | Admitting: Internal Medicine

## 2015-12-23 ENCOUNTER — Encounter: Payer: Self-pay | Admitting: Internal Medicine

## 2015-12-23 DIAGNOSIS — Z760 Encounter for issue of repeat prescription: Secondary | ICD-10-CM

## 2015-12-23 DIAGNOSIS — I10 Essential (primary) hypertension: Secondary | ICD-10-CM

## 2015-12-23 MED ORDER — AMLODIPINE BESYLATE 5 MG PO TABS: 5.0000 mg | ORAL_TABLET | Freq: Every day | ORAL | 3 refills | Status: DC

## 2015-12-23 NOTE — Patient Instructions (Signed)
Stop the diltiazem (cardizem) and start amlodipine '5mg'$  per day

## 2015-12-23 NOTE — Progress Notes (Signed)
Pre visit review using our clinic review tool, if applicable. No additional management support is needed unless otherwise documented below in the visit note. 
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
## 2015-12-27 ENCOUNTER — Encounter: Payer: Self-pay | Admitting: Internal Medicine

## 2015-12-27 NOTE — Assessment & Plan Note (Signed)
Blood pressure remains elevated.  He is on metoprolol.  Heart rate is controlled and he remains in SR.  Will stop cardizem.  Start amlodipine '5mg'$  q day.  Continue hctz '25mg'$  q day and quinapril '40mg'$  q day.  Continue to have him spot check his pressure.  Follow pressures.  Get him back in soon to reassess.

## 2016-01-01 ENCOUNTER — Emergency Department: Payer: Medicare Other

## 2016-01-01 ENCOUNTER — Other Ambulatory Visit: Payer: Self-pay

## 2016-01-01 ENCOUNTER — Emergency Department
Admission: EM | Admit: 2016-01-01 | Discharge: 2016-01-01 | Disposition: A | Discharge status: Home / Self Care | Payer: Medicare Other | Attending: Emergency Medicine | Admitting: Emergency Medicine

## 2016-01-01 ENCOUNTER — Encounter: Payer: Self-pay | Admitting: Emergency Medicine

## 2016-01-01 ENCOUNTER — Telehealth: Payer: Self-pay | Admitting: Internal Medicine

## 2016-01-01 DIAGNOSIS — J449 Chronic obstructive pulmonary disease, unspecified: Secondary | ICD-10-CM | POA: Insufficient documentation

## 2016-01-01 DIAGNOSIS — I10 Essential (primary) hypertension: Secondary | ICD-10-CM | POA: Diagnosis not present

## 2016-01-01 DIAGNOSIS — Z7982 Long term (current) use of aspirin: Secondary | ICD-10-CM | POA: Insufficient documentation

## 2016-01-01 DIAGNOSIS — Z79899 Other long term (current) drug therapy: Secondary | ICD-10-CM | POA: Diagnosis not present

## 2016-01-01 DIAGNOSIS — Z85118 Personal history of other malignant neoplasm of bronchus and lung: Secondary | ICD-10-CM | POA: Insufficient documentation

## 2016-01-01 DIAGNOSIS — R079 Chest pain, unspecified: Secondary | ICD-10-CM

## 2016-01-01 DIAGNOSIS — R0789 Other chest pain: Secondary | ICD-10-CM | POA: Diagnosis not present

## 2016-01-01 DIAGNOSIS — Z87891 Personal history of nicotine dependence: Secondary | ICD-10-CM | POA: Insufficient documentation

## 2016-01-01 LAB — BASIC METABOLIC PANEL
Anion gap: 9 (ref 5–15)
BUN: 8 mg/dL (ref 6–20)
CALCIUM: 9.3 mg/dL (ref 8.9–10.3)
CO2: 30 mmol/L (ref 22–32)
CREATININE: 0.81 mg/dL (ref 0.61–1.24)
Chloride: 94 mmol/L — ABNORMAL LOW (ref 101–111)
GFR calc Af Amer: >60 mL/min (ref >60)
Glucose, Bld: 99 mg/dL (ref 65–99)
Potassium: 3.6 mmol/L (ref 3.5–5.1)
SODIUM: 133 mmol/L — AB (ref 135–145)

## 2016-01-01 LAB — CBC
HCT: 39.4 % — ABNORMAL LOW (ref 40.0–52.0)
Hemoglobin: 13.2 g/dL (ref 13.0–18.0)
MCH: 28.2 pg (ref 26.0–34.0)
MCHC: 33.4 g/dL (ref 32.0–36.0)
MCV: 84.4 fL (ref 80.0–100.0)
PLATELETS: 247 10*3/uL (ref 150–440)
RBC: 4.67 MIL/uL (ref 4.40–5.90)
RDW: 14 % (ref 11.5–14.5)
WBC: 3.7 10*3/uL — AB (ref 3.8–10.6)

## 2016-01-01 LAB — TROPONIN I: Troponin I: <0.03 ng/mL (ref <0.03)

## 2016-01-01 NOTE — ED Notes (Signed)
ED Provider at bedside. 

## 2016-01-01 NOTE — Telephone Encounter (Signed)
Arrived in ED at 1626. noted

## 2016-01-01 NOTE — Telephone Encounter (Signed)
Awaiting [facility] note.

## 2016-01-01 NOTE — Telephone Encounter (Signed)
Patient Name: Adrian Cohen  DOB: 1940-05-18    Initial Comment Estill Bamberg with [facility] w/ pt complaining of left sided chest heaviness. Blood pressure for today has been 164/86 and 134/73.Estill Bamberg, nurse w/ [facility] called with pt, pt was disc. Transferring to urgent again.   Nurse Assessment  Nurse: Christel Mormon, RN, Levada Dy Date/Time Eilene Ghazi Time): 01/01/2016 3:21:51 PM  Confirm and document reason for call. If symptomatic, describe symptoms. ---Caller states bood pressure for today has been 164/86 and 134/73. He is having some left sided chest heaviness which started around 12:30 and lasted about 20 minutes and went away then came back around 1:30pm.  Does the patient have any new or worsening symptoms? ---Yes  Will a triage be completed? ---Yes  Related visit to physician within the last 2 weeks? ---No  Does the PT have any chronic conditions? (i.e. diabetes, asthma, etc.) ---Yes  List chronic conditions. ---HTN, lung cancer and had surgery in April or May -partial left pneumonectomy.  Is this a behavioral health or substance abuse call? ---No     Guidelines    Guideline Title Affirmed Question Affirmed Notes  Chest Pain [1] Chest pain lasts > 5 minutes AND [2] age > 33    Final Disposition User   Call EMS 911 Now [place], RN, Levada Dy    Comments  Pt changed mind and will call EMS.   Referrals  [facility] - ED   Disagree/Comply: Disagree  Disagree/Comply Reason: Disagree with instructions

## 2016-01-01 NOTE — ED Notes (Signed)
Lab called in reference to delay in troponin results.  Explained delay to the patient.

## 2016-01-01 NOTE — Telephone Encounter (Signed)
Sent pt's call to [facility] Triage for the chest heaviness.

## 2016-01-01 NOTE — Telephone Encounter (Signed)
Noted  

## 2016-01-01 NOTE — Discharge Instructions (Signed)
Please seek medical attention for any high fevers, chest pain, shortness of breath, change in behavior, persistent vomiting, bloody stool or any other new or concerning symptoms.  

## 2016-01-01 NOTE — Telephone Encounter (Signed)
Attempted to reach patient, left a VM.  Not showing in ED yet.

## 2016-01-01 NOTE — Telephone Encounter (Signed)
Blood pressures do look better.  Agree with ER evaluation if chest pain.  Please make sure pt went to ER.
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
## 2016-01-01 NOTE — Telephone Encounter (Signed)
Noted.  See other note.  To ER

## 2016-01-01 NOTE — Telephone Encounter (Signed)
FYI [facility] spoke with patient he was advised to go to the ER via EMS. See there note. thanks

## 2016-01-01 NOTE — ED Triage Notes (Signed)
Pt comes into the ED via EMS from home c/o left sided chest pain with no radiation.  Patient denies shortness of breath, dizziness, N/V.  States the pain occurred earlier today and he took 325 asp.  Patient explains that the pain has decreased since then.  Presents in NAD at this time with even and unlabored respirations.  12 lead unremarkable, CBG 113, h/o lung cancer with left lobe removal.

## 2016-01-01 NOTE — Telephone Encounter (Signed)
fyi

## 2016-01-01 NOTE — Telephone Encounter (Signed)
Pt called with bp readings:  11/21 - 130/71 11/22 - 141/72 11/23 - 165/84, 170/90 11/24 - 159/87, 139/78, 137/79 11/25 - 125/68, 141/86 11/26 - 120/60, 140/78, 150/79 11/27 - 118/67, 165/85 11/28 - 143/76, 127/62, 126/63 11/29 141/74, 130/72  11/30 164/86, 134/73  Pt called and also c/o left chest heaviness.  Call pt @ [phone]

## 2016-01-02 ENCOUNTER — Telehealth: Payer: Self-pay | Admitting: Internal Medicine

## 2016-01-02 NOTE — Telephone Encounter (Signed)
Scheduled, thanks!

## 2016-01-02 NOTE — Telephone Encounter (Signed)
Patient was advised to follow up in 1-2 days.  No changes to his symptoms  Since ED. No appts available.  Please let me know where to put him.  Caryl Pina suggested maybe Wednesday, but you already have extended.  Thanks

## 2016-01-02 NOTE — Telephone Encounter (Signed)
Left a detailed message to call me back. thanks

## 2016-01-02 NOTE — Telephone Encounter (Signed)
Noted.  Thanks.  Ok to schedule.

## 2016-01-02 NOTE — Telephone Encounter (Signed)
Pt went to ED for chest heaviness and was advised to follow up with Dr. Nicki Reaper. Pt stated that they found nothing and that he is still having that heaviness.Please advise, thank you!  Call pt @ [phone]

## 2016-01-02 NOTE — Telephone Encounter (Signed)
Feels good today, no pain or discomfort. He is thinking It is just acid reflux symptoms.  He has no SOB, no pain, no fevers.  Did the ECG, blood work and an xray at the ED and changed no medications.  He was okay with Wednesday at 1pm appt.

## 2016-01-02 NOTE — Telephone Encounter (Signed)
Pt called wanting to schedule a appointment from being seen at the ER. I was advised to forward message to you. I did not see anything available to schedule pt. Thank you!

## 2016-01-02 NOTE — Telephone Encounter (Signed)
He can be scheduled at 1:00 on Wednesday.  Please confirm with pt that he is doing ok.

## 2016-01-07 ENCOUNTER — Encounter: Payer: Self-pay | Admitting: Internal Medicine

## 2016-01-07 ENCOUNTER — Ambulatory Visit (INDEPENDENT_AMBULATORY_CARE_PROVIDER_SITE_OTHER): Payer: Medicare Other | Admitting: Internal Medicine

## 2016-01-07 VITALS — BP 138/78 | HR 76 | Temp 97.8°F | Ht 71.0 in | Wt 168.0 lb

## 2016-01-07 DIAGNOSIS — C3412 Malignant neoplasm of upper lobe, left bronchus or lung: Secondary | ICD-10-CM

## 2016-01-07 DIAGNOSIS — I10 Essential (primary) hypertension: Secondary | ICD-10-CM | POA: Diagnosis not present

## 2016-01-07 DIAGNOSIS — E7800 Pure hypercholesterolemia, unspecified: Secondary | ICD-10-CM

## 2016-01-07 DIAGNOSIS — I739 Peripheral vascular disease, unspecified: Secondary | ICD-10-CM

## 2016-01-07 DIAGNOSIS — R079 Chest pain, unspecified: Secondary | ICD-10-CM

## 2016-01-07 DIAGNOSIS — R0789 Other chest pain: Secondary | ICD-10-CM

## 2016-01-07 MED ORDER — HYDROCORTISONE 2.5 % EX CREA: TOPICAL_CREAM | Freq: Two times a day (BID) | CUTANEOUS | 1 refills | Status: DC

## 2016-01-07 MED ORDER — AMLODIPINE BESYLATE 5 MG PO TABS: 5.0000 mg | ORAL_TABLET | Freq: Every day | ORAL | 2 refills | Status: DC

## 2016-01-07 MED ORDER — QUINAPRIL HCL 40 MG PO TABS: 40.0000 mg | ORAL_TABLET | Freq: Every day | ORAL | 2 refills | Status: DC
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
## 2016-01-07 NOTE — Progress Notes (Signed)
Pre visit review using our clinic review tool, if applicable. No additional management support is needed unless otherwise documented below in the visit note. 

## 2016-01-11 ENCOUNTER — Encounter: Payer: Self-pay | Admitting: Internal Medicine

## 2016-01-11 DIAGNOSIS — R0789 Other chest pain: Secondary | ICD-10-CM | POA: Insufficient documentation

## 2016-01-11 NOTE — Assessment & Plan Note (Signed)
Blood pressure is doing better.  Reviewed his checks.  Blood pressure averaging - 120-130s/70-80s.  Continue same medication regimen.  Follow pressures.  Follow metabolic panel.

## 2016-01-11 NOTE — Assessment & Plan Note (Signed)
Continue daily aspirin.  Continue risk factor modification.

## 2016-01-11 NOTE — Assessment & Plan Note (Signed)
Had the episode of chest tightness as outlined.  Seen in ER.  W/up as outlined.  No chest tightness like he experienced - since his discharge.  Given the chest tightness and risk factors, will refer to cardiology for further evaluation and question of need for further testing.  Pt in agreement.

## 2016-01-11 NOTE — Assessment & Plan Note (Signed)
S/p lobectomy - adenocarcinoma.  Continues to f/u with oncology.  Continue f/u with pulmonary.

## 2016-01-11 NOTE — Assessment & Plan Note (Signed)
Low cholesterol diet and exercise.  Follow lipid panel.  On welchol.

## 2016-01-13 ENCOUNTER — Other Ambulatory Visit: Payer: Self-pay | Admitting: Internal Medicine

## 2016-01-14 NOTE — Telephone Encounter (Signed)
Please advise on refill. Historical provider. Patient last seen 01/07/16

## 2016-02-10 ENCOUNTER — Other Ambulatory Visit: Payer: Self-pay

## 2016-02-10 ENCOUNTER — Telehealth: Payer: Self-pay | Admitting: Internal Medicine

## 2016-02-10 DIAGNOSIS — Z760 Encounter for issue of repeat prescription: Secondary | ICD-10-CM

## 2016-02-10 MED ORDER — METOPROLOL TARTRATE 50 MG PO TABS: 50.0000 mg | ORAL_TABLET | Freq: Two times a day (BID) | ORAL | 0 refills | Status: DC

## 2016-02-10 NOTE — Telephone Encounter (Signed)
Pt needs refill on metoprolol (LOPRESSOR) 50 MG tablet sent to [facility] on [address] Please advise

## 2016-02-10 NOTE — Telephone Encounter (Signed)
Last office visit 01/07/16 Next office visit 12/09/16 with Denisa  Please advise

## 2016-02-11 MED ORDER — METOPROLOL TARTRATE 50 MG PO TABS: 50.0000 mg | ORAL_TABLET | Freq: Two times a day (BID) | ORAL | 0 refills | Status: AC

## 2016-02-11 NOTE — Telephone Encounter (Signed)
rx ok'd for metoprolol #180 with no refills.  Needs a f/u appt scheduled over the next 2 months.

## 2016-02-12 NOTE — Telephone Encounter (Signed)
Appointment scheduled.

## 2016-02-13 NOTE — Telephone Encounter (Signed)
Patient needs appointment . Please schedule.  Thanks

## 2016-02-16 NOTE — Telephone Encounter (Signed)
Lm on vm to call back and schedule appt in a couple months

## 2016-02-20 ENCOUNTER — Ambulatory Visit
Admission: RE | Admit: 2016-02-20 | Payer: Medicare PPO | Source: Ambulatory Visit | Admitting: Unknown Physician Specialty

## 2016-02-20 SURGERY — COLONOSCOPY WITH PROPOFOL
Anesthesia: General

## 2016-03-02 ENCOUNTER — Other Ambulatory Visit: Payer: Self-pay | Admitting: Internal Medicine

## 2016-03-02 DIAGNOSIS — I10 Essential (primary) hypertension: Secondary | ICD-10-CM

## 2016-03-03 NOTE — Telephone Encounter (Signed)
Last OV 01/07/16 last lab checked 01/01/16 last filled by historical provider

## 2016-03-04 NOTE — Telephone Encounter (Signed)
Please confirm with pt that he is taking regularly and has been taking regularly.  If so, ok to refill kcl #90 with one refill.

## 2016-03-04 NOTE — Telephone Encounter (Signed)
lmtrc to office.

## 2016-03-04 NOTE — Telephone Encounter (Signed)
Spoke with patient is taking have faxed refill

## 2016-03-25 ENCOUNTER — Encounter: Payer: Self-pay | Admitting: *Deleted

## 2016-03-26 ENCOUNTER — Ambulatory Visit
Admission: RE | Admit: 2016-03-26 | Discharge: 2016-03-26 | Disposition: A | Discharge status: Home / Self Care | Payer: Medicare PPO | Source: Ambulatory Visit | Attending: Unknown Physician Specialty | Admitting: Unknown Physician Specialty

## 2016-03-26 ENCOUNTER — Ambulatory Visit: Payer: Medicare PPO | Admitting: Anesthesiology

## 2016-03-26 ENCOUNTER — Encounter
Admission: RE | Disposition: A | Discharge status: Home / Self Care | Payer: Self-pay | Source: Ambulatory Visit | Attending: Unknown Physician Specialty

## 2016-03-26 DIAGNOSIS — K210 Gastro-esophageal reflux disease with esophagitis: Secondary | ICD-10-CM | POA: Insufficient documentation

## 2016-03-26 DIAGNOSIS — Z87891 Personal history of nicotine dependence: Secondary | ICD-10-CM | POA: Insufficient documentation

## 2016-03-26 DIAGNOSIS — I10 Essential (primary) hypertension: Secondary | ICD-10-CM | POA: Insufficient documentation

## 2016-03-26 DIAGNOSIS — Z1211 Encounter for screening for malignant neoplasm of colon: Secondary | ICD-10-CM | POA: Insufficient documentation

## 2016-03-26 DIAGNOSIS — N400 Enlarged prostate without lower urinary tract symptoms: Secondary | ICD-10-CM | POA: Diagnosis not present

## 2016-03-26 DIAGNOSIS — E7800 Pure hypercholesterolemia, unspecified: Secondary | ICD-10-CM | POA: Diagnosis not present

## 2016-03-26 DIAGNOSIS — D123 Benign neoplasm of transverse colon: Secondary | ICD-10-CM | POA: Insufficient documentation

## 2016-03-26 DIAGNOSIS — K449 Diaphragmatic hernia without obstruction or gangrene: Secondary | ICD-10-CM | POA: Insufficient documentation

## 2016-03-26 DIAGNOSIS — Z85118 Personal history of other malignant neoplasm of bronchus and lung: Secondary | ICD-10-CM | POA: Diagnosis not present

## 2016-03-26 DIAGNOSIS — K317 Polyp of stomach and duodenum: Secondary | ICD-10-CM | POA: Insufficient documentation

## 2016-03-26 DIAGNOSIS — D122 Benign neoplasm of ascending colon: Secondary | ICD-10-CM | POA: Diagnosis not present

## 2016-03-26 DIAGNOSIS — J449 Chronic obstructive pulmonary disease, unspecified: Secondary | ICD-10-CM | POA: Insufficient documentation

## 2016-03-26 DIAGNOSIS — Z79899 Other long term (current) drug therapy: Secondary | ICD-10-CM | POA: Diagnosis not present

## 2016-03-26 DIAGNOSIS — I739 Peripheral vascular disease, unspecified: Secondary | ICD-10-CM | POA: Insufficient documentation

## 2016-03-26 DIAGNOSIS — R490 Dysphonia: Secondary | ICD-10-CM | POA: Insufficient documentation

## 2016-03-26 DIAGNOSIS — Z7982 Long term (current) use of aspirin: Secondary | ICD-10-CM | POA: Diagnosis not present

## 2016-03-26 DIAGNOSIS — K648 Other hemorrhoids: Secondary | ICD-10-CM | POA: Insufficient documentation

## 2016-03-26 DIAGNOSIS — Z86010 Personal history of colonic polyps: Secondary | ICD-10-CM | POA: Insufficient documentation

## 2016-03-26 HISTORY — PX: ESOPHAGOGASTRODUODENOSCOPY (EGD) WITH PROPOFOL: SHX5813

## 2016-03-26 HISTORY — PX: COLONOSCOPY WITH PROPOFOL: SHX5780

## 2016-03-26 SURGERY — COLONOSCOPY WITH PROPOFOL
Anesthesia: General

## 2016-03-26 MED ORDER — LIDOCAINE HCL (PF) 2 % IJ SOLN
INTRAMUSCULAR | Status: AC
  Filled 2016-03-26: qty 2

## 2016-03-26 MED ORDER — PROPOFOL 500 MG/50ML IV EMUL
INTRAVENOUS | Status: DC | PRN
  Administered 2016-03-26: 120 ug/kg/min via INTRAVENOUS

## 2016-03-26 MED ORDER — PHENYLEPHRINE HCL 10 MG/ML IJ SOLN
INTRAMUSCULAR | Status: AC
  Filled 2016-03-26: qty 1

## 2016-03-26 MED ORDER — PROPOFOL 10 MG/ML IV BOLUS
INTRAVENOUS | Status: AC
  Filled 2016-03-26: qty 20

## 2016-03-26 MED ORDER — SODIUM CHLORIDE 0.9 % IV SOLN
INTRAVENOUS | Status: DC
  Administered 2016-03-26: 1000 mL via INTRAVENOUS

## 2016-03-26 MED ORDER — LIDOCAINE HCL (CARDIAC) 20 MG/ML IV SOLN
INTRAVENOUS | Status: DC | PRN
  Administered 2016-03-26: 1.5 mL via INTRAVENOUS

## 2016-03-26 MED ORDER — PHENYLEPHRINE HCL 10 MG/ML IJ SOLN
INTRAMUSCULAR | Status: DC | PRN
  Administered 2016-03-26 (×3): 100 ug via INTRAVENOUS

## 2016-03-26 MED ORDER — PROPOFOL 10 MG/ML IV BOLUS
INTRAVENOUS | Status: DC | PRN
  Administered 2016-03-26: 30 mg via INTRAVENOUS

## 2016-03-26 NOTE — Op Note (Signed)
[facility] Patient Name: Kallan Merrick Procedure Date: 03/26/2016 11:15 AM MRN: [id number] Account #: [id number] Date of Birth: 03/09/1940 Admit Type: Outpatient Age: 76 Room: [facility] ENDO ROOM 3 Gender: Male Note Status: Finalized Procedure:            Colonoscopy Indications:          High risk colon cancer surveillance: Personal history                        of colonic polyps Providers:            Manya Silvas, MD Referring MD:         Einar Pheasant, MD (Referring MD) Medicines:            Propofol per Anesthesia Complications:        No immediate complications. Procedure:            Pre-Anesthesia Assessment:                       - After reviewing the risks and benefits, the patient                        was deemed in satisfactory condition to undergo the                        procedure.                       After obtaining informed consent, the colonoscope was                        passed under direct vision. Throughout the procedure,                        the patient's blood pressure, pulse, and oxygen                        saturations were monitored continuously. The                        Colonoscope was introduced through the anus and                        advanced to the the cecum, identified by appendiceal                        orifice and ileocecal valve. The colonoscopy was                        performed without difficulty. The patient tolerated the                        procedure well. The quality of the bowel preparation                        was good. Findings:      A 10 mm polyp was found in the transverse colon. The polyp was sessile.       The polyp was removed with a hot snare. Resection and retrieval were       complete. To prevent bleeding after the polypectomy, two hemostatic  clips were successfully placed. There was no bleeding during, or at the       end, of the procedure.      A small polyp was  found in the transverse colon. The polyp was sessile.       The polyp was removed with a hot snare. Resection and retrieval were       complete.      A diminutive polyp was found in the transverse colon. The polyp was       sessile. The polyp was removed with a cold snare. Resection and       retrieval were complete.      Two sessile polyps were found in the ascending colon. The polyps were       diminutive in size. These polyps were removed with a jumbo cold forceps.       Resection and retrieval were complete. Impression:           - One 10 mm polyp in the transverse colon, removed with                        a hot snare. Resected and retrieved. Clips were placed.                       - One small polyp in the transverse colon, removed with                        a hot snare. Resected and retrieved.                       - One diminutive polyp in the transverse colon, removed                        with a cold snare. Resected and retrieved.                       - Two diminutive polyps in the ascending colon, removed                        with a jumbo cold forceps. Resected and retrieved. Recommendation:       - Await pathology results. Manya Silvas, MD 03/26/2016 12:27:57 PM This report has been signed electronically. Number of Addenda: 0 Note Initiated On: 03/26/2016 11:15 AM Scope Withdrawal Time: 0 hours 12 minutes 50 seconds  Total Procedure Duration: 0 hours 28 minutes 31 seconds       [facility]

## 2016-03-26 NOTE — Anesthesia Postprocedure Evaluation (Signed)
Anesthesia Post Note  Patient: Adrian Cohen  Procedure(s) Performed: Procedure(s) (LRB): COLONOSCOPY WITH PROPOFOL (N/A) ESOPHAGOGASTRODUODENOSCOPY (EGD) WITH PROPOFOL (N/A)  Anesthesia Type: General     Last Vitals:  Vitals:   03/26/16 1215 03/26/16 1224  BP: 122/76 136/70  Pulse: 68 62  Resp: 16 13  Temp:      Last Pain:  Vitals:   03/26/16 1214  TempSrc: Tympanic                 VAN STAVEREN,Jacobi Ryant

## 2016-03-26 NOTE — Anesthesia Postprocedure Evaluation (Signed)
Anesthesia Post Note  Patient: Adrian Cohen  Procedure(s) Performed: Procedure(s) (LRB): COLONOSCOPY WITH PROPOFOL (N/A) ESOPHAGOGASTRODUODENOSCOPY (EGD) WITH PROPOFOL (N/A)  Patient location during evaluation: PACU Anesthesia Type: General Level of consciousness: awake Pain management: pain level controlled Vital Signs Assessment: post-procedure vital signs reviewed and stable Respiratory status: spontaneous breathing Cardiovascular status: stable Anesthetic complications: no     Last Vitals:  Vitals:   03/26/16 1215 03/26/16 1224  BP: 122/76 136/70  Pulse: 68 62  Resp: 16 13  Temp:      Last Pain:  Vitals:   03/26/16 1214  TempSrc: Tympanic                 VAN STAVEREN,Elena Davia

## 2016-03-26 NOTE — Anesthesia Preprocedure Evaluation (Signed)
Anesthesia Evaluation  Patient identified by MRN, date of birth, ID band Patient awake    Reviewed: Allergy & Precautions, NPO status , Patient's Chart, lab work & pertinent test results  Airway Mallampati: II       Dental  (+) Upper Dentures   Pulmonary COPD,  COPD inhaler, former smoker,  Hx of lung ca    + decreased breath sounds      Cardiovascular Exercise Tolerance: Good hypertension, Pt. on medications and Pt. on home beta blockers + Peripheral Vascular Disease   Rhythm:Regular Rate:Normal     Neuro/Psych    GI/Hepatic Neg liver ROS, GERD  Medicated,  Endo/Other  negative endocrine ROS  Renal/GU negative Renal ROS     Musculoskeletal   Abdominal Normal abdominal exam  (+)   Peds  Hematology  (+) anemia ,   Anesthesia Other Findings   Reproductive/Obstetrics                             Anesthesia Physical Anesthesia Plan  ASA: III  Anesthesia Plan: General   Post-op Pain Management:    Induction: Intravenous  Airway Management Planned: Natural Airway and Nasal Cannula  Additional Equipment:   Intra-op Plan:   Post-operative Plan:   Informed Consent: I have reviewed the patients History and Physical, chart, labs and discussed the procedure including the risks, benefits and alternatives for the proposed anesthesia with the patient or authorized representative who has indicated his/her understanding and acceptance.     Plan Discussed with: Surgeon  Anesthesia Plan Comments:         Anesthesia Quick Evaluation

## 2016-03-26 NOTE — Op Note (Signed)
[facility] Patient Name: Adrian Cohen Procedure Date: 03/26/2016 11:15 AM MRN: [id number] Account #: [id number] Date of Birth: 1941/01/17 Admit Type: Outpatient Age: 76 Room: [facility] ENDO ROOM 3 Gender: Male Note Status: Finalized Procedure:            Upper GI endoscopy Indications:          Heartburn, Follow-up of gastro-esophageal reflux disease Providers:            Manya Silvas, MD Referring MD:         Einar Pheasant, MD (Referring MD) Medicines:            Propofol per Anesthesia Complications:        No immediate complications. Procedure:            Pre-Anesthesia Assessment:                       - After reviewing the risks and benefits, the patient                        was deemed in satisfactory condition to undergo the                        procedure.                       After obtaining informed consent, the endoscope was                        passed under direct vision. Throughout the procedure,                        the patient's blood pressure, pulse, and oxygen                        saturations were monitored continuously. The Endoscope                        was introduced through the mouth, and advanced to the                        second part of duodenum. The upper GI endoscopy was                        accomplished without difficulty. The patient tolerated                        the procedure well. Findings:      LA Grade A (one or more mucosal breaks less than 5 mm, not extending       between tops of 2 mucosal folds) esophagitis with no bleeding was found       39 cm from the incisors. Biopsies were taken with a cold forceps for       histology.      Localized mildly erythematous mucosa without bleeding was found in the       gastric antrum. Biopsies were taken with a cold forceps for Helicobacter       pylori testing.      The examined duodenum was normal.      A single diminutive sessile polyp with no  bleeding and no stigmata of  recent bleeding was found on the greater curvature of the gastric body.       Biopsies were taken with a cold forceps for histology.      A large hiatal hernia was present. 6cm or so. Impression:           - LA Grade A reflux esophagitis. Rule out Barrett's                        esophagus. Biopsied.                       - Erythematous mucosa in the antrum. Biopsied.                       - Normal examined duodenum. Recommendation:       - Await pathology results.                       - The findings and recommendations were discussed with                        the patient's family.                       - soft food for 3 days, eat slowly, chew well, take                        small bites VERY IMPORTANT TO EAT VERY SOFT FOOD. Manya Silvas, MD 03/26/2016 11:39:02 AM This report has been signed electronically. Number of Addenda: 0 Note Initiated On: 03/26/2016 11:15 AM      [facility]

## 2016-03-26 NOTE — Transfer of Care (Signed)
Immediate Anesthesia Transfer of Care Note  Patient: Adrian Cohen  Procedure(s) Performed: Procedure(s): COLONOSCOPY WITH PROPOFOL (N/A) ESOPHAGOGASTRODUODENOSCOPY (EGD) WITH PROPOFOL (N/A)  Patient Location: PACU  Anesthesia Type:General  Level of Consciousness: awake and responds to stimulation  Airway & Oxygen Therapy: Patient Spontanous Breathing and Patient connected to nasal cannula oxygen  Post-op Assessment: Report given to RN and Post -op Vital signs reviewed and stable  Post vital signs: Reviewed and stable  Last Vitals:  Vitals:   03/26/16 1214 03/26/16 1215  BP: (P) 122/76 122/76  Pulse:  68  Resp:  16  Temp: (!) (P) 36 C     Last Pain:  Vitals:   03/26/16 1214  TempSrc: (P) Tympanic         Complications: No apparent anesthesia complications

## 2016-03-26 NOTE — Anesthesia Post-op Follow-up Note (Signed)
Anesthesia QCDR form completed.        

## 2016-03-29 ENCOUNTER — Encounter: Payer: Self-pay | Admitting: Unknown Physician Specialty

## 2016-03-29 LAB — SURGICAL PATHOLOGY
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
## 2016-03-31 ENCOUNTER — Encounter: Payer: Self-pay | Admitting: Unknown Physician Specialty

## 2016-04-19 ENCOUNTER — Ambulatory Visit (INDEPENDENT_AMBULATORY_CARE_PROVIDER_SITE_OTHER): Payer: Medicare PPO | Admitting: Internal Medicine

## 2016-04-19 ENCOUNTER — Encounter: Payer: Self-pay | Admitting: Internal Medicine

## 2016-04-19 DIAGNOSIS — D649 Anemia, unspecified: Secondary | ICD-10-CM | POA: Diagnosis not present

## 2016-04-19 DIAGNOSIS — I739 Peripheral vascular disease, unspecified: Secondary | ICD-10-CM

## 2016-04-19 DIAGNOSIS — R0989 Other specified symptoms and signs involving the circulatory and respiratory systems: Secondary | ICD-10-CM | POA: Diagnosis not present

## 2016-04-19 DIAGNOSIS — E7800 Pure hypercholesterolemia, unspecified: Secondary | ICD-10-CM

## 2016-04-19 DIAGNOSIS — C3412 Malignant neoplasm of upper lobe, left bronchus or lung: Secondary | ICD-10-CM

## 2016-04-19 DIAGNOSIS — E871 Hypo-osmolality and hyponatremia: Secondary | ICD-10-CM

## 2016-04-19 DIAGNOSIS — I10 Essential (primary) hypertension: Secondary | ICD-10-CM

## 2016-04-19 DIAGNOSIS — D72819 Decreased white blood cell count, unspecified: Secondary | ICD-10-CM | POA: Diagnosis not present

## 2016-04-19 NOTE — Progress Notes (Signed)
Pre-visit discussion using our clinic review tool. No additional management support is needed unless otherwise documented below in the visit note.  
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
## 2016-04-22 ENCOUNTER — Other Ambulatory Visit: Payer: Self-pay | Admitting: Internal Medicine

## 2016-04-22 DIAGNOSIS — Z760 Encounter for issue of repeat prescription: Secondary | ICD-10-CM

## 2016-04-25 ENCOUNTER — Encounter: Payer: Self-pay | Admitting: Internal Medicine

## 2016-04-25 NOTE — Assessment & Plan Note (Signed)
Carotid ultrasound 05/2015 50-69% right internal carotid and <50% left internal carotid.  Schedule f/u with vascular surgery for f/u carotid ultrasound.

## 2016-04-25 NOTE — Assessment & Plan Note (Signed)
On welchol.  Did not tolerate statin medication.  Low cholesterol diet and exercise.  Follow lipid panel.

## 2016-04-25 NOTE — Assessment & Plan Note (Signed)
Blood pressure under good control.  Continue same medication regimen.  Follow pressures.  Follow metabolic panel.   

## 2016-04-25 NOTE — Assessment & Plan Note (Signed)
s/p lobectomy - adenocarcinoma.  Continues to f/u with oncology.  Continue f/u with pulmonary.  Breathing stable.

## 2016-04-25 NOTE — Assessment & Plan Note (Signed)
Stable.  Follow.   

## 2016-04-25 NOTE — Assessment & Plan Note (Signed)
Stable.  Follow cbc.

## 2016-04-25 NOTE — Assessment & Plan Note (Signed)
Follow cbc.  

## 2016-04-25 NOTE — Assessment & Plan Note (Signed)
Continue daily aspirin.  Continue risk factor modification.

## 2016-04-28 ENCOUNTER — Ambulatory Visit: Admit: 2016-04-28 | Payer: Medicare PPO | Admitting: Unknown Physician Specialty

## 2016-04-28 SURGERY — COLONOSCOPY WITH PROPOFOL
Anesthesia: General

## 2016-05-06 ENCOUNTER — Other Ambulatory Visit (INDEPENDENT_AMBULATORY_CARE_PROVIDER_SITE_OTHER): Payer: Medicare PPO

## 2016-05-06 DIAGNOSIS — D649 Anemia, unspecified: Secondary | ICD-10-CM | POA: Diagnosis not present

## 2016-05-06 DIAGNOSIS — E7800 Pure hypercholesterolemia, unspecified: Secondary | ICD-10-CM

## 2016-05-06 DIAGNOSIS — I10 Essential (primary) hypertension: Secondary | ICD-10-CM

## 2016-05-06 LAB — BASIC METABOLIC PANEL
BUN: 8 mg/dL (ref 6–23)
CHLORIDE: 97 meq/L (ref 96–112)
CO2: 33 mEq/L — ABNORMAL HIGH (ref 19–32)
CREATININE: 0.86 mg/dL (ref 0.40–1.50)
Calcium: 9.5 mg/dL (ref 8.4–10.5)
GFR: 111.47 mL/min (ref >60.00)
Glucose, Bld: 94 mg/dL (ref 70–99)
Potassium: 4.1 mEq/L (ref 3.5–5.1)
Sodium: 135 mEq/L (ref 135–145)

## 2016-05-06 LAB — LIPID PANEL
Cholesterol: 208 mg/dL — ABNORMAL HIGH (ref 0–200)
HDL: 71.2 mg/dL (ref >39.00)
LDL Cholesterol: 125 mg/dL — ABNORMAL HIGH (ref 0–99)
NONHDL: 137.27
TRIGLYCERIDES: 59 mg/dL (ref 0.0–149.0)
Total CHOL/HDL Ratio: 3
VLDL: 11.8 mg/dL (ref 0.0–40.0)

## 2016-05-06 LAB — HEPATIC FUNCTION PANEL
ALBUMIN: 4.3 g/dL (ref 3.5–5.2)
ALK PHOS: 60 U/L (ref 39–117)
ALT: 8 U/L (ref 0–53)
AST: 13 U/L (ref 0–37)
BILIRUBIN DIRECT: 0.1 mg/dL (ref 0.0–0.3)
Total Bilirubin: 0.4 mg/dL (ref 0.2–1.2)
Total Protein: 7 g/dL (ref 6.0–8.3)

## 2016-05-06 LAB — CBC WITH DIFFERENTIAL/PLATELET
BASOS ABS: 0 10*3/uL (ref 0.0–0.1)
Basophils Relative: 0.8 % (ref 0.0–3.0)
Eosinophils Absolute: 0.1 10*3/uL (ref 0.0–0.7)
Eosinophils Relative: 2.5 % (ref 0.0–5.0)
HCT: 41.2 % (ref 39.0–52.0)
Hemoglobin: 13.8 g/dL (ref 13.0–17.0)
LYMPHS ABS: 1.1 10*3/uL (ref 0.7–4.0)
Lymphocytes Relative: 26.6 % (ref 12.0–46.0)
MCHC: 33.5 g/dL (ref 30.0–36.0)
MCV: 87.4 fl (ref 78.0–100.0)
MONO ABS: 0.6 10*3/uL (ref 0.1–1.0)
Monocytes Relative: 14.6 % — ABNORMAL HIGH (ref 3.0–12.0)
NEUTROS PCT: 55.5 % (ref 43.0–77.0)
Neutro Abs: 2.3 10*3/uL (ref 1.4–7.7)
Platelets: 331 10*3/uL (ref 150.0–400.0)
RBC: 4.72 Mil/uL (ref 4.22–5.81)
RDW: 13 % (ref 11.5–15.5)
WBC: 4.1 10*3/uL (ref 4.0–10.5)

## 2016-05-06 LAB — TSH: TSH: 1.01 u[IU]/mL (ref 0.35–4.50)

## 2016-05-06 LAB — FERRITIN: FERRITIN: 62.9 ng/mL (ref 22.0–322.0)

## 2016-06-30 ENCOUNTER — Other Ambulatory Visit (INDEPENDENT_AMBULATORY_CARE_PROVIDER_SITE_OTHER): Payer: Self-pay

## 2016-06-30 DIAGNOSIS — I6523 Occlusion and stenosis of bilateral carotid arteries: Secondary | ICD-10-CM
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
## 2016-07-01 ENCOUNTER — Ambulatory Visit (INDEPENDENT_AMBULATORY_CARE_PROVIDER_SITE_OTHER): Payer: Medicare PPO

## 2016-07-01 ENCOUNTER — Ambulatory Visit (INDEPENDENT_AMBULATORY_CARE_PROVIDER_SITE_OTHER): Payer: Medicare PPO | Admitting: Vascular Surgery

## 2016-07-01 ENCOUNTER — Encounter (INDEPENDENT_AMBULATORY_CARE_PROVIDER_SITE_OTHER): Payer: Self-pay | Admitting: Vascular Surgery

## 2016-07-01 VITALS — BP 160/83 | HR 67 | Resp 16 | Ht 70.5 in | Wt 166.0 lb

## 2016-07-01 DIAGNOSIS — E7800 Pure hypercholesterolemia, unspecified: Secondary | ICD-10-CM

## 2016-07-01 DIAGNOSIS — I6523 Occlusion and stenosis of bilateral carotid arteries: Secondary | ICD-10-CM

## 2016-07-01 DIAGNOSIS — I10 Essential (primary) hypertension: Secondary | ICD-10-CM

## 2016-07-01 DIAGNOSIS — I739 Peripheral vascular disease, unspecified: Secondary | ICD-10-CM

## 2016-07-01 LAB — VAS US CAROTID
LCCADDIAS: -13 cm/s
LCCADSYS: -58 cm/s
LEFT ECA DIAS: -8 cm/s
LEFT VERTEBRAL DIAS: 12 cm/s
LICADDIAS: -24 cm/s
LICADSYS: -74 cm/s
Left CCA prox dias: 16 cm/s
Left CCA prox sys: 72 cm/s
Left ICA prox dias: -14 cm/s
Left ICA prox sys: -58 cm/s
RCCADSYS: -65 cm/s
RIGHT CCA MID DIAS: 17 cm/s
RIGHT ECA DIAS: -11 cm/s
RIGHT VERTEBRAL DIAS: 19 cm/s
Right CCA prox dias: 16 cm/s
Right CCA prox sys: 90 cm/s

## 2016-07-08 ENCOUNTER — Other Ambulatory Visit: Payer: Self-pay | Admitting: Internal Medicine

## 2016-07-10 ENCOUNTER — Other Ambulatory Visit: Payer: Self-pay | Admitting: Internal Medicine

## 2016-08-12 ENCOUNTER — Other Ambulatory Visit: Payer: Self-pay | Admitting: Internal Medicine

## 2016-08-12 DIAGNOSIS — I10 Essential (primary) hypertension: Secondary | ICD-10-CM

## 2016-08-19 ENCOUNTER — Ambulatory Visit (INDEPENDENT_AMBULATORY_CARE_PROVIDER_SITE_OTHER): Payer: Medicare PPO

## 2016-08-19 ENCOUNTER — Encounter: Payer: Self-pay | Admitting: Internal Medicine

## 2016-08-19 ENCOUNTER — Ambulatory Visit (INDEPENDENT_AMBULATORY_CARE_PROVIDER_SITE_OTHER): Payer: Medicare PPO | Admitting: Internal Medicine

## 2016-08-19 VITALS — BP 128/74 | HR 82 | Temp 97.8°F | Resp 12 | Ht 71.0 in | Wt 165.0 lb

## 2016-08-19 DIAGNOSIS — C3412 Malignant neoplasm of upper lobe, left bronchus or lung: Secondary | ICD-10-CM

## 2016-08-19 DIAGNOSIS — K219 Gastro-esophageal reflux disease without esophagitis: Secondary | ICD-10-CM | POA: Diagnosis not present

## 2016-08-19 DIAGNOSIS — R0989 Other specified symptoms and signs involving the circulatory and respiratory systems: Secondary | ICD-10-CM

## 2016-08-19 DIAGNOSIS — E7800 Pure hypercholesterolemia, unspecified: Secondary | ICD-10-CM

## 2016-08-19 DIAGNOSIS — R05 Cough: Secondary | ICD-10-CM

## 2016-08-19 DIAGNOSIS — R042 Hemoptysis: Secondary | ICD-10-CM

## 2016-08-19 DIAGNOSIS — D72819 Decreased white blood cell count, unspecified: Secondary | ICD-10-CM | POA: Diagnosis not present

## 2016-08-19 DIAGNOSIS — I10 Essential (primary) hypertension: Secondary | ICD-10-CM

## 2016-08-19 DIAGNOSIS — I739 Peripheral vascular disease, unspecified: Secondary | ICD-10-CM | POA: Diagnosis not present

## 2016-08-19 DIAGNOSIS — R059 Cough, unspecified: Secondary | ICD-10-CM

## 2016-08-19 MED ORDER — AZITHROMYCIN 250 MG PO TABS: ORAL_TABLET | ORAL | 0 refills | Status: DC
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
## 2016-08-19 NOTE — Progress Notes (Signed)
Pre-visit discussion using our clinic review tool. No additional management support is needed unless otherwise documented below in the visit note.  

## 2016-08-20 ENCOUNTER — Ambulatory Visit: Payer: Medicare PPO | Admitting: Internal Medicine

## 2016-08-20 ENCOUNTER — Telehealth: Payer: Self-pay

## 2016-08-20 NOTE — Telephone Encounter (Addendum)
See Chest Aggie Hacker  08/19/16 report  Result in EPIC .   IMPRESSION: Severe COPD changes with postsurgical changes in the LEFT chest question prior LEFT upper lobectomy.  New pleural-based opacity at RIGHT apex with an additional 3.5 cm diameter area of upper lobe opacity highly concerning for pulmonary neoplasm; CT chest with contrast recommended for further evaluation

## 2016-08-20 NOTE — Telephone Encounter (Signed)
I spoke to [place] at Dr [facility] office.  Dr Raul Del not in the office today.  Explained would forward xray to him.  He is seeing him.  Please forward xray report for him to review.

## 2016-08-20 NOTE — Telephone Encounter (Signed)
Faxed report to office

## 2016-08-22 ENCOUNTER — Encounter: Payer: Self-pay | Admitting: Internal Medicine

## 2016-08-22 NOTE — Assessment & Plan Note (Signed)
On welchol.  Did not tolerate statin medications.  Follow lipid panel.

## 2016-08-22 NOTE — Assessment & Plan Note (Signed)
Cough with blood tinged sputum.  Back pain as outlined.  Has improved.   Check cxr.  Treat with azithromycin.  Follow.

## 2016-08-22 NOTE — Assessment & Plan Note (Signed)
Continue risk factor modification.  Continue daily aspirin.

## 2016-08-22 NOTE — Assessment & Plan Note (Signed)
Follow cbc.  Stable.

## 2016-08-22 NOTE — Assessment & Plan Note (Signed)
Controlled on current regimen.  Follow.  

## 2016-08-22 NOTE — Assessment & Plan Note (Signed)
Carotid ultrasound <50%.  Saw [facility] 07/01/16.  Recommended f/u in 12 months.

## 2016-08-22 NOTE — Assessment & Plan Note (Signed)
Blood pressure on recheck improved.  Same medication regimen.  Follow pressures.  Follow metabolic panel.

## 2016-08-22 NOTE — Assessment & Plan Note (Signed)
S/p lobectomy - adenocarcinoma.  Continues to f/u with CT surgery and is seeing Dr Raul Del.  With the pain and cough as outlined - recheck cxr.  Treat with zpak.  Last evaluated in 04/2016 by CT surgery. Felt stable.  Recommended f/u in 6 months.

## 2016-08-23 ENCOUNTER — Telehealth: Payer: Self-pay | Admitting: Internal Medicine

## 2016-08-23 NOTE — Telephone Encounter (Signed)
Have you received

## 2016-08-23 NOTE — Telephone Encounter (Signed)
Pt called and is requesting xray results. Please advise, thank you!  Call pt @ [phone]

## 2016-08-24 NOTE — Telephone Encounter (Signed)
Pt notified of cxr results.  Informed had sent to Dr Raul Del for review.  Some improvement in pain.  Still with cough. Will f/u with pulmonary and CT surgery regarding f/u CT.

## 2016-08-25 ENCOUNTER — Telehealth: Payer: Self-pay | Admitting: Internal Medicine

## 2016-08-25 MED ORDER — TRAMADOL HCL 50 MG PO TABS: 50.0000 mg | ORAL_TABLET | Freq: Two times a day (BID) | ORAL | 0 refills | Status: DC | PRN

## 2016-08-25 NOTE — Telephone Encounter (Signed)
Pt called and was wondering if Dr. Nicki Reaper could call in some pain medication. He has tried OTC meds but it is not working, he is sleeping. Please advise, thank you!  Call pt @ [phone]

## 2016-08-25 NOTE — Telephone Encounter (Signed)
Spoke to pt.  Informed that I had talked with Adrian Cohen with Dr Adrian Cohen.  Explained current symptoms.  Also discussed the need for CT scan.  They will work pt in and call pt with results.  Mr Adrian Cohen notified.  Does feel needs something more for pain.  Taking tylenol.  Instructed him to avoid antiinflammatories.  rx for tramadol.  Any change - evaluation.

## 2016-08-25 NOTE — Telephone Encounter (Signed)
Added to another message

## 2016-08-25 NOTE — Telephone Encounter (Signed)
Patient also called with the following message as well as the message regarding the CT   Pt called and was wondering if Dr. Nicki Reaper could call in some pain medication. He has tried OTC meds but it is not working, he is sleeping. Please advise, thank you!  Call pt @ [phone]

## 2016-08-25 NOTE — Telephone Encounter (Signed)
Pt called trying to find out if we put in an order for a CT scan for [facility]. Based on note below Dr. Raul Del was to be following up on this. Pt stated that Dr. Gust Brooms office has no information on this. Please advise, thank you!

## 2016-08-26 MED ORDER — TIZANIDINE HCL 2 MG PO CAPS: 2.0000 mg | ORAL_CAPSULE | Freq: Every evening | ORAL | 0 refills | Status: AC | PRN

## 2016-08-26 NOTE — Telephone Encounter (Signed)
I can send in rx for muscle relaxer.  (zanaflex is what I usually use secondary to less side effects).  He can take the muscle relaxer at night and tramadol through the day.  If persistent increased pain, will need to be reevaluated.  Did [facility] call back with appt.

## 2016-08-26 NOTE — Telephone Encounter (Signed)
Patient states that he is taking the tramadol 1 tab bid and the otc meds that you told him to take and it is not helping. The tramadol is only helping for about 3 hrs and the otc meds only help for around three hrs as well.

## 2016-08-26 NOTE — Telephone Encounter (Signed)
Called patient wife is on Baclofen 10mg  bid wanted to know if he could take that?

## 2016-08-26 NOTE — Telephone Encounter (Signed)
rx sent in for zanaflex.

## 2016-08-26 NOTE — Telephone Encounter (Signed)
Pt called back wanting to know if he can take his wife muscle relaxer? Please advise?  Call pt @ [phone]. Thank you!

## 2016-08-26 NOTE — Telephone Encounter (Signed)
Has app on 8/[facility]. He would like the Zanaflex called in to pharmacy

## 2016-08-26 NOTE — Telephone Encounter (Signed)
I sent in tramadol for him.  He can try this for pain.

## 2016-08-30 ENCOUNTER — Telehealth: Payer: Self-pay | Admitting: Internal Medicine

## 2016-08-30 NOTE — Telephone Encounter (Signed)
Given that he is having increased pain and now more hemoptysis, I recommend evaluation now.  Since he is wanting his scan at [facility],  I would recommend to ER for evaluation.

## 2016-08-30 NOTE — Telephone Encounter (Signed)
Pt saw Dr. Nicki Reaper almost 2 weeks ago and was prescribed a Z pak. Pt still coughing up blood and is darker than before. Please advise, [phone].

## 2016-08-30 NOTE — Telephone Encounter (Signed)
Patient stated he is still coughing up blood, looks like a clunk with the mucus he is coughing up. Patient has been on two courses of Z PAK .  Patient stated that the mucus ids totally red, no fever  just coughs this up 4 to 5 times per day or more. Can't sleep at night. Hurts under right should er blade and hurts bad cannot rest. HX of lung cancer on the left side has been scheduled for CT on Friday morning.  Patient would like to know if he needs another Z-pak and could he get something stronger for the pain, denies SOB.

## 2016-08-30 NOTE — Telephone Encounter (Signed)
Patient stated he will go to ED at [facility] as recommended. FYI

## 2016-09-03 ENCOUNTER — Other Ambulatory Visit: Payer: Medicare PPO

## 2016-09-10 ENCOUNTER — Emergency Department: Payer: Medicare PPO

## 2016-09-10 ENCOUNTER — Emergency Department
Admission: EM | Admit: 2016-09-10 | Discharge: 2016-09-11 | Disposition: A | Discharge status: Home / Self Care | Payer: Medicare PPO | Attending: Emergency Medicine | Admitting: Emergency Medicine

## 2016-09-10 ENCOUNTER — Other Ambulatory Visit: Payer: Medicare PPO

## 2016-09-10 ENCOUNTER — Encounter: Payer: Self-pay | Admitting: Emergency Medicine

## 2016-09-10 DIAGNOSIS — I10 Essential (primary) hypertension: Secondary | ICD-10-CM | POA: Insufficient documentation

## 2016-09-10 DIAGNOSIS — Z85118 Personal history of other malignant neoplasm of bronchus and lung: Secondary | ICD-10-CM | POA: Insufficient documentation

## 2016-09-10 DIAGNOSIS — E871 Hypo-osmolality and hyponatremia: Secondary | ICD-10-CM

## 2016-09-10 DIAGNOSIS — J449 Chronic obstructive pulmonary disease, unspecified: Secondary | ICD-10-CM | POA: Insufficient documentation

## 2016-09-10 DIAGNOSIS — Z79899 Other long term (current) drug therapy: Secondary | ICD-10-CM | POA: Insufficient documentation

## 2016-09-10 DIAGNOSIS — Z87891 Personal history of nicotine dependence: Secondary | ICD-10-CM | POA: Diagnosis not present

## 2016-09-10 DIAGNOSIS — M549 Dorsalgia, unspecified: Secondary | ICD-10-CM | POA: Diagnosis present

## 2016-09-10 DIAGNOSIS — G8929 Other chronic pain: Secondary | ICD-10-CM | POA: Diagnosis not present

## 2016-09-10 DIAGNOSIS — R918 Other nonspecific abnormal finding of lung field: Secondary | ICD-10-CM

## 2016-09-10 DIAGNOSIS — M546 Pain in thoracic spine: Secondary | ICD-10-CM | POA: Insufficient documentation

## 2016-09-10 DIAGNOSIS — R042 Hemoptysis: Secondary | ICD-10-CM

## 2016-09-10 LAB — CBC WITH DIFFERENTIAL/PLATELET
BASOS ABS: 0.1 10*3/uL (ref 0–0.1)
BASOS PCT: 1 %
Eosinophils Absolute: 0.2 10*3/uL (ref 0–0.7)
Eosinophils Relative: 3 %
HEMATOCRIT: 36.9 % — AB (ref 40.0–52.0)
Hemoglobin: 12.6 g/dL — ABNORMAL LOW (ref 13.0–18.0)
Lymphocytes Relative: 15 %
Lymphs Abs: 0.9 10*3/uL — ABNORMAL LOW (ref 1.0–3.6)
MCH: 28.6 pg (ref 26.0–34.0)
MCHC: 34 g/dL (ref 32.0–36.0)
MCV: 84.1 fL (ref 80.0–100.0)
MONO ABS: 0.8 10*3/uL (ref 0.2–1.0)
Monocytes Relative: 14 %
NEUTROS ABS: 3.9 10*3/uL (ref 1.4–6.5)
Neutrophils Relative %: 67 %
PLATELETS: 400 10*3/uL (ref 150–440)
RBC: 4.39 MIL/uL — AB (ref 4.40–5.90)
RDW: 13 % (ref 11.5–14.5)
WBC: 5.8 10*3/uL (ref 3.8–10.6)

## 2016-09-10 LAB — COMPREHENSIVE METABOLIC PANEL
ALBUMIN: 3.8 g/dL (ref 3.5–5.0)
ALT: 11 U/L — AB (ref 17–63)
AST: 16 U/L (ref 15–41)
Alkaline Phosphatase: 67 U/L (ref 38–126)
Anion gap: 11 (ref 5–15)
BUN: 9 mg/dL (ref 6–20)
CALCIUM: 9.3 mg/dL (ref 8.9–10.3)
CHLORIDE: 86 mmol/L — AB (ref 101–111)
CO2: 29 mmol/L (ref 22–32)
CREATININE: 0.71 mg/dL (ref 0.61–1.24)
GFR calc Af Amer: >60 mL/min (ref >60)
GFR calc non Af Amer: >60 mL/min (ref >60)
Glucose, Bld: 147 mg/dL — ABNORMAL HIGH (ref 65–99)
POTASSIUM: 4 mmol/L (ref 3.5–5.1)
Sodium: 126 mmol/L — ABNORMAL LOW (ref 135–145)
TOTAL PROTEIN: 7.2 g/dL (ref 6.5–8.1)
Total Bilirubin: 0.7 mg/dL (ref 0.3–1.2)

## 2016-09-10 MED ORDER — SODIUM CHLORIDE 0.9 % IV BOLUS (SEPSIS)
1000.0000 mL | Freq: Once | INTRAVENOUS | Status: AC
  Administered 2016-09-10: 1000 mL via INTRAVENOUS

## 2016-09-10 MED ORDER — IOPAMIDOL (ISOVUE-370) INJECTION 76%
75.0000 mL | Freq: Once | INTRAVENOUS | Status: AC | PRN
  Administered 2016-09-10: 75 mL via INTRAVENOUS

## 2016-09-10 MED ORDER — TRAMADOL HCL 50 MG PO TABS: 100.0000 mg | ORAL_TABLET | Freq: Four times a day (QID) | ORAL | 0 refills | Status: AC | PRN
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
## 2016-09-10 NOTE — Discharge Instructions (Signed)
Results for orders placed or performed during the hospital encounter of 09/10/16  CBC with Differential  Result Value Ref Range   WBC 5.8 3.8 - 10.6 K/uL   RBC 4.39 (L) 4.40 - 5.90 MIL/uL   Hemoglobin 12.6 (L) 13.0 - 18.0 g/dL   HCT 36.9 (L) 40.0 - 52.0 %   MCV 84.1 80.0 - 100.0 fL   MCH 28.6 26.0 - 34.0 pg   MCHC 34.0 32.0 - 36.0 g/dL   RDW 13.0 11.5 - 14.5 %   Platelets 400 150 - 440 K/uL   Neutrophils Relative % 67 %   Neutro Abs 3.9 1.4 - 6.5 K/uL   Lymphocytes Relative 15 %   Lymphs Abs 0.9 (L) 1.0 - 3.6 K/uL   Monocytes Relative 14 %   Monocytes Absolute 0.8 0.2 - 1.0 K/uL   Eosinophils Relative 3 %   Eosinophils Absolute 0.2 0 - 0.7 K/uL   Basophils Relative 1 %   Basophils Absolute 0.1 0 - 0.1 K/uL  Comprehensive metabolic panel  Result Value Ref Range   Sodium 126 (L) 135 - 145 mmol/L   Potassium 4.0 3.5 - 5.1 mmol/L   Chloride 86 (L) 101 - 111 mmol/L   CO2 29 22 - 32 mmol/L   Glucose, Bld 147 (H) 65 - 99 mg/dL   BUN 9 6 - 20 mg/dL   Creatinine, Ser 0.71 0.61 - 1.24 mg/dL   Calcium 9.3 8.9 - 10.3 mg/dL   Total Protein 7.2 6.5 - 8.1 g/dL   Albumin 3.8 3.5 - 5.0 g/dL   AST 16 15 - 41 U/L   ALT 11 (L) 17 - 63 U/L   Alkaline Phosphatase 67 38 - 126 U/L   Total Bilirubin 0.7 0.3 - 1.2 mg/dL   GFR calc non Af Amer >60 >60 mL/min   GFR calc Af Amer >60 >60 mL/min   Anion gap 11 5 - 15   Dg Chest 2 View  Result Date: 09/10/2016 CLINICAL DATA:  Chest and back pain, shortness of breath and hematemesis. EXAM: CHEST  2 VIEW COMPARISON:  08/19/2016 as well as CT of the chest on 01/24/2015 FINDINGS: Severe emphysematous lung disease is again noted with multiple large emphysematous blebs primarily along the medial aspects of both lungs. These blebs abut the mediastinum and simulate the presence of pneumomediastinum, especially adjacent to the aortic knob and region of the main pulmonary artery and AP window. It would be extremely difficult to exclude pneumomediastinum by chest  x-ray and correlation is suggested with clinical symptoms. CT of the chest would be needed if there is any concern for esophageal perforation or rupture of a blend into the mediastinum causing acute symptoms. No evidence of pneumothorax. No edema, airspace consolidation or pleural fluid is identified. The heart size is within normal limits. The bony thorax is unremarkable. IMPRESSION: Severe emphysematous lung disease with large emphysematous blebs, primarily along the medial aspects of both lungs and abutting the mediastinum by prior CT. It would be extremely difficult to exclude pneumomediastinum by chest x-ray. CT of the chest is recommended if there is any concern for acute pneumomediastinum due to rupture of a bleb or esophageal perforation. Electronically Signed   By: Aletta Edouard M.D.   On: 09/10/2016 18:48   Dg Chest 2 View  Result Date: 08/20/2016 CLINICAL DATA:  Congestion, cough, blood-tinged mucus, history COPD, hypertension, lung cancer EXAM: CHEST  2 VIEW COMPARISON:  01/01/2016 FINDINGS: Normal heart size, mediastinal contours, and pulmonary vascularity. Atherosclerotic calcification  aorta. Emphysematous and minimal bronchitic changes consistent with COPD. Bullous disease particularly at LEFT apex with evidence of prior LEFT lung resection. New pleural-based opacity at the RIGHT apex with an additional area of masslike opacification at RIGHT apex question 3.5 cm diameter highly concerning for tumor. No acute infiltrate, pleural effusion, or pneumothorax. Bones appear demineralized. IMPRESSION: Severe COPD changes with postsurgical changes in the LEFT chest question prior LEFT upper lobectomy. New pleural-based opacity at RIGHT apex with an additional 3.5 cm diameter area of upper lobe opacity highly concerning for pulmonary neoplasm; CT chest with contrast recommended for further evaluation. Aortic Atherosclerosis (ICD10-I70.0) and Emphysema (ICD10-J43.9). These results will be called to the  ordering clinician or representative by the Radiologist Assistant, and communication documented in the PACS or zVision Dashboard. Electronically Signed   By: Lavonia Dana M.D.   On: 08/20/2016 08:39   Ct Angio Chest Pe W And/or Wo Contrast  Result Date: 09/10/2016 CLINICAL DATA:  History lung cancer with low back pain x3 weeks. Blood tinged phlegm with cough. EXAM: CT ANGIOGRAPHY CHEST WITH CONTRAST TECHNIQUE: Multidetector CT imaging of the chest was performed using the standard protocol during bolus administration of intravenous contrast. Multiplanar CT image reconstructions and MIPs were obtained to evaluate the vascular anatomy. CONTRAST:  75 cc Isovue 370 IV COMPARISON:  Chest CT from 01/24/2015 FINDINGS: Cardiovascular: No acute pulmonary embolus. Aortic atherosclerosis with coronary arteriosclerosis along the LAD and proximal RCA. No pericardial effusion. Heart size is normal. Mediastinum/Nodes: No evidence of pneumomediastinum. The trachea and mainstem bronchi are patent. The esophagus is nonacute. No thyromegaly or mass. No mediastinal or hilar lymphadenopathy. Lungs/Pleura: Interval development of pleural thickening and/or loculated fluid along the periphery of the right upper lobe. There is a partially obscured 3 cm mass posteriorly in the right upper lobe merging with this area of pleural thickening or fluid. Findings are concerning for neoplasm, series 6, image 24. Right paramediastinal pulmonary blebs and bullae are noted, similar in appearance to prior. Masslike opacity seen in the left upper lobe abutting the major fissure is no longer visualized. Calcified pleural plaque are noted along the posterior aspect of the right hemithorax. There is mild bronchiectatic change in the right upper lobe adjacent to the mass and posterior segment of right upper lobe. Upper Abdomen: No acute abnormality. No adrenal mass. Partially included left upper pole renal cyst measuring on the order of 13 and 18 mm.  Musculoskeletal: Patchy demineralization of the ribs adjacent to the area of pleural thickening in the right upper lobe. Review of the MIP images confirms the above findings. IMPRESSION: 1. Interval development of a 3 cm masslike opacity, partially obscured by presumed pleural thickening overlying the posterior aspect of the right upper lobe. Findings are concerning for neoplasm, less likely rounded atelectasis associated with pleural disease. Consider one of the following in 3 months for both low-risk and high-risk individuals: (a) repeat chest CT, (b) follow-up PET-CT, or (c) tissue sampling. This recommendation follows the consensus statement: Guidelines for Management of Incidental Pulmonary Nodules Detected on CT Images: From the Fleischner Society 2017; Radiology 2017; 284:228-243. Patchy subtle demineralization of adjacent ribs deep to the rib pleural thickening and mass without frank bone destruction or fracture. 2. Pulmonary bulla and blebs along the mediastinum more so on the right accounting for hyperlucency on same chest radiographs. No pneumomediastinum. 3. Masslike opacity along the left major fissure is no longer apparent. 4. No acute pulmonary embolus. 5. Aortic atherosclerosis without aneurysm or dissection. Coronary arteriosclerosis. 6.  Left upper pole renal cysts. Aortic Atherosclerosis (ICD10-I70.0) and Emphysema (ICD10-J43.9). Electronically Signed   By: Ashley Royalty M.D.   On: 09/10/2016 20:09

## 2016-09-10 NOTE — ED Notes (Signed)
Report to Kenny, RN

## 2016-09-10 NOTE — ED Notes (Signed)

## 2016-09-10 NOTE — ED Notes (Signed)
Pt reports lower back pain X 3 weeks, has been taking oxycodone and tramadol for pain. Pt reports he may have hurt his back while caring for his wife. Pt also reports blood tinged phlegm with cough. Pt hx of lung cancer.

## 2016-09-10 NOTE — ED Notes (Signed)
Pt asked if he can have some water to take Tramadol that he brought from home; MD asked if it was ok for the patient to take his home medication; MD affirmed that it was ok for the patient to take his medication. Pt notified and water provided.

## 2016-09-10 NOTE — ED Triage Notes (Signed)
C/O back pain x 3 weeks.  The most pain ro left scapula.  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  Patient also c.o hemapoiesis and bronchitis.  Patient has been treated for Bronchitis -- has been given a z-pak and another antibiotics.  Finished antibiotics a couple of days ago.  Had PET scan yesterday, because patient has history of left lung ca.

## 2016-09-10 NOTE — ED Notes (Signed)
ED Provider at bedside. 

## 2016-09-10 NOTE — ED Notes (Signed)
Pt ambulated per MD order; pt ambulated approximately 100 ft from the stretcher, out of the room, down the hall, and back to the room and stretcher. Pt's resting HR was 105-106, and O2 sats were 95% - 96%; after ambulation, pt's HR was 114-115, and O2 sats were at 89% - 90%. MD notified.

## 2016-09-11 NOTE — ED Notes (Signed)
E-sign pad not working in the room; paper copy of discharge signature obtained and attached to pt's stickers.

## 2016-09-13 ENCOUNTER — Ambulatory Visit (INDEPENDENT_AMBULATORY_CARE_PROVIDER_SITE_OTHER): Payer: Medicare PPO | Admitting: Family Medicine

## 2016-09-13 ENCOUNTER — Encounter: Payer: Self-pay | Admitting: Family Medicine

## 2016-09-13 ENCOUNTER — Other Ambulatory Visit: Payer: Self-pay | Admitting: Family Medicine

## 2016-09-13 VITALS — BP 140/70 | HR 84 | Temp 97.6°F | Wt 159.8 lb

## 2016-09-13 DIAGNOSIS — E871 Hypo-osmolality and hyponatremia: Secondary | ICD-10-CM

## 2016-09-13 DIAGNOSIS — R042 Hemoptysis: Secondary | ICD-10-CM | POA: Diagnosis not present

## 2016-09-13 DIAGNOSIS — R938 Abnormal findings on diagnostic imaging of other specified body structures: Secondary | ICD-10-CM | POA: Diagnosis not present

## 2016-09-13 DIAGNOSIS — R9389 Abnormal findings on diagnostic imaging of other specified body structures: Secondary | ICD-10-CM

## 2016-09-13 LAB — CBC
HCT: 36.4 % — ABNORMAL LOW (ref 39.0–52.0)
Hemoglobin: 12.3 g/dL — ABNORMAL LOW (ref 13.0–17.0)
MCHC: 33.7 g/dL (ref 30.0–36.0)
MCV: 86.5 fl (ref 78.0–100.0)
PLATELETS: 410 10*3/uL — AB (ref 150.0–400.0)
RBC: 4.21 Mil/uL — AB (ref 4.22–5.81)
RDW: 12.4 % (ref 11.5–15.5)
WBC: 6.6 10*3/uL (ref 4.0–10.5)

## 2016-09-13 LAB — BASIC METABOLIC PANEL
BUN: 7 mg/dL (ref 6–23)
CHLORIDE: 86 meq/L — AB (ref 96–112)
CO2: 31 mEq/L (ref 19–32)
Calcium: 9.4 mg/dL (ref 8.4–10.5)
Creatinine, Ser: 0.65 mg/dL (ref 0.40–1.50)
GFR: 153.84 mL/min (ref >60.00)
GLUCOSE: 115 mg/dL — AB (ref 70–99)
POTASSIUM: 3.8 meq/L (ref 3.5–5.1)
SODIUM: 123 meq/L — AB (ref 135–145)

## 2016-09-13 MED ORDER — OXYCODONE HCL 5 MG PO TABS: 5.0000 mg | ORAL_TABLET | ORAL | 0 refills | Status: DC | PRN

## 2016-09-13 NOTE — Assessment & Plan Note (Addendum)
Patient with history of lung cancer with recent PET scan showing concerning lesion. Has had dyspnea and hemoptysis over the last month. Recent CT angiogram in the emergency room revealed right upper lobe lesion though no PE. He has had persistent pain that is not responding very well to tramadol. 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 Does have small pupils that they are bilateral. No ptosis. Discussed that I believe his pain is likely from the lesion in his right lung. Discussed pain treatment with oxycodone. Discussed that they need to see his physicians at [facility] for further evaluation. Advised him and his daughter to contact them to let them know what is going on and to plan on having the biopsy as scheduled. I discussed return precautions. We will additionally obtain lab work as outlined below based on his lab work from the emergency room.

## 2016-09-13 NOTE — Patient Instructions (Signed)
Nice to meet you. We will treat her pain with oxycodone. We will check some lab work today given your low sodium while you were in the emergency room. If you develop worsening pain, worsening shortness of breath, or you develop chest pain, or any new or changing symptoms please seek medical attention.

## 2016-09-13 NOTE — Progress Notes (Signed)
Adrian Rumps, MD Phone: [phone]  Adrian Cohen is a 76 y.o. male who presents today for same-day visit.  Patient presents today following emergency room evaluation for upper back pain, dyspnea, and hemoptysis. He does have a history of lung cancer and recently underwent a PET scan which showed a right upper lobe lesion. He is scheduled for biopsy on Friday. He spoke with the cardiothoracic surgery fellow at [facility] last week and they advised an emergency room evaluation given his symptoms. He had a CT angiogram that showed a new 3 cm lesion in the right upper lung though was negative for PE. He notes lidocaine patches and tramadol have not been helpful with his discomfort. Notes his discomfort is underneath his right shoulder blade. Oxycodone in the past has been somewhat more beneficial than tramadol. He notes bilateral foot swelling as well. Also notes sweating of the right side of his scalp though no ptosis. He's had no numbness or weakness. No vision changes. No chest pain. States hemoptysis consists of small balls of her blood 4-5 times a day.  PMH: Former smoker   ROS see history of present illness  Objective  Physical Exam Vitals:   09/13/16 1124  BP: 140/70  Pulse: 84  Temp: 97.6 F (36.4 C)  SpO2: 96%    BP Readings from Last 3 Encounters:  09/13/16 140/70  09/10/16 (!) 156/85  08/22/16 128/74   Wt Readings from Last 3 Encounters:  09/13/16 159 lb 12.8 oz (72.5 kg)  09/10/16 165 lb (74.8 kg)  08/19/16 165 lb (74.8 kg)    Physical Exam  Constitutional: No distress.  Cardiovascular: Normal rate, regular rhythm and normal heart sounds.   Pulmonary/Chest: Effort normal and breath sounds normal. No respiratory distress. He has no wheezes. He has no rales.  Musculoskeletal:  Mild discomfort over right mid thoracic posterior and lateral ribs  Neurological: He is alert. Gait normal.  CN 2-12 intact, 5/5 strength in bilateral biceps, triceps, grip,  quads, hamstrings, plantar and dorsiflexion, sensation to light touch intact in bilateral UE and LE, normal gait  Skin: Skin is warm and dry. He is not diaphoretic.     Assessment/Plan: Please see individual problem list.  Abnormal chest CT Patient with history of lung cancer with recent PET scan showing concerning lesion. Has had dyspnea and hemoptysis over the last month. Recent CT angiogram in the emergency room revealed right upper lobe lesion though no PE. He has had persistent pain that is not responding very well to tramadol. 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 Does have small pupils that they are bilateral. No ptosis. Discussed that I believe his pain is likely from the lesion in his right lung. Discussed pain treatment with oxycodone. Discussed that they need to see his physicians at [facility] for further evaluation. Advised him and his daughter to contact them to let them know what is going on and to plan on having the biopsy as scheduled. I discussed return precautions. We will additionally obtain lab work as outlined below based on his lab work from the emergency room.   Orders Placed This Encounter  Procedures  . CBC  . Basic Metabolic Panel (BMET)    Meds ordered this encounter  Medications  . oxyCODONE (OXY IR/ROXICODONE) 5 MG immediate release tablet    Sig: Take 1 tablet (5  mg total) by mouth every 4 (four) hours as needed for severe pain.    Dispense:  30 tablet    Refill:  0   Adrian Rumps, MD [place]

## 2016-09-15 ENCOUNTER — Telehealth: Payer: Self-pay | Admitting: *Deleted

## 2016-09-15 ENCOUNTER — Other Ambulatory Visit (INDEPENDENT_AMBULATORY_CARE_PROVIDER_SITE_OTHER): Payer: Medicare PPO

## 2016-09-15 ENCOUNTER — Other Ambulatory Visit: Payer: Self-pay | Admitting: Internal Medicine

## 2016-09-15 DIAGNOSIS — E871 Hypo-osmolality and hyponatremia: Secondary | ICD-10-CM

## 2016-09-15 LAB — BASIC METABOLIC PANEL
BUN: 12 mg/dL (ref 6–23)
CO2: 33 mEq/L — ABNORMAL HIGH (ref 19–32)
CREATININE: 0.62 mg/dL (ref 0.40–1.50)
Calcium: 9.2 mg/dL (ref 8.4–10.5)
Chloride: 86 mEq/L — ABNORMAL LOW (ref 96–112)
GFR: 162.46 mL/min (ref >60.00)
GLUCOSE: 112 mg/dL — AB (ref 70–99)
Potassium: 4 mEq/L (ref 3.5–5.1)
Sodium: 123 mEq/L — ABNORMAL LOW (ref 135–145)

## 2016-09-15 NOTE — Telephone Encounter (Signed)
Patient came into office for labs and C/O pain rated at an 8 advised patient of PCP advice below , patient handed me both bottles of pain medication, verified patient taking Oxycodone IR 5 mg , patient stated makes him feel out of it, ask patient how he was taking medication he stated on empty stomach, patient also advised nurse he was wearing a salon Pas patch on his back and this seem to help some. Attained patient vitals T 97.6 02 sat @ 95 to 94 on room air , Pulse 91 BP 150/78 , pain level 8 last dose of pain medication percocet 5/325 at 8:30 am. Spoke with Dr. Derrel Nip and was advised patient could take 325 mg tylenol with the immediate release Oxycodone that this may slow down the rate of exsorbtion and allow patient not to feel so out of it or Wooziness , also ask ok for patient to use 2 salonpas was advised ok to do one to back and one to side under arm. . Patient daughter were advised of recommendations and were pleased to try this for the night advised them to call to morrow and let PCP know if this was working. Also advised patient may help to eat something before taking medication not to take on empty stomach. FYI

## 2016-09-15 NOTE — Telephone Encounter (Signed)
I agree with your documentation

## 2016-09-15 NOTE — Telephone Encounter (Signed)
Just to clarify, the note states the oxycodone 5mg  makes him feel off, but that he tolerated oxycodone 5/325.  The only difference in these medications is the 325mg  of tylenol in the oxycodone5/325.  If he is having mental status changes or feeling off, my concern is that he needs f/u regarding his sodium level.  He apparently is scheduled for a biopsy this week with oncology.  I would recommend if any mental status change, etc -needs evaluation.  Also, since seeing oncology this week and they gave medication that helped, would recommend him let them know pain is increasing and needs something for pain.  Let me know if any problems.

## 2016-09-15 NOTE — Telephone Encounter (Signed)
Called patient spoke with him and his daughter. He states that the script for ocycodone 5 mg given by Dr. Caryl Bis he is not able to take. States that it make him fill "off" and made his chest hurt. He is currently taking Tramadol (took last two tab this am) and otc pain patch. Pain is 8/10. He states that was given oxycodone 5-325 2 tabs every 6 hrs by oncology a few weeks ago and that helped and was able to take that. Wanted to know if he could just get script for that.

## 2016-09-15 NOTE — Telephone Encounter (Signed)
Called and spoke with patient regarding lab work. Advised his sodium was unchanged from prior. He notes his only complaint currently is the pain that he was dealing with previously. He notes when he took the oxycodone earlier it made him feel funny and he has a hard time describing this. He was not confused or disoriented. I discussed with him the concern with that is that it could be related to his sodium or the medication. He notes he feels normal at this time. I discussed that if he continued to have issues with feeling funny or if he developed nausea, vomiting, dizziness, confusion, or any new symptoms he needs to go to the emergency room. He noted he would do this. I encouraged him to take in adequate food to help with his sodium. We will check in with his PCP tomorrow to discuss further management.

## 2016-09-15 NOTE — Telephone Encounter (Signed)
Thank you for following up with him and f/u lab.  It appears he is supposed to call with update tomorrow.

## 2016-09-15 NOTE — Telephone Encounter (Signed)
Patient continues to have pain and nausea from the oxycodone. Pt's daughter stated that pt would like to try another medication Pt contact [phone]

## 2016-09-16 ENCOUNTER — Emergency Department: Payer: Medicare PPO

## 2016-09-16 ENCOUNTER — Emergency Department
Admission: EM | Admit: 2016-09-16 | Discharge: 2016-09-16 | Disposition: A | Discharge status: Home / Self Care | Payer: Medicare PPO | Attending: Emergency Medicine | Admitting: Emergency Medicine

## 2016-09-16 ENCOUNTER — Other Ambulatory Visit: Payer: Self-pay

## 2016-09-16 ENCOUNTER — Encounter: Payer: Self-pay | Admitting: Emergency Medicine

## 2016-09-16 DIAGNOSIS — K5900 Constipation, unspecified: Secondary | ICD-10-CM | POA: Diagnosis not present

## 2016-09-16 DIAGNOSIS — Z79899 Other long term (current) drug therapy: Secondary | ICD-10-CM | POA: Insufficient documentation

## 2016-09-16 DIAGNOSIS — E871 Hypo-osmolality and hyponatremia: Secondary | ICD-10-CM | POA: Diagnosis not present

## 2016-09-16 DIAGNOSIS — Z7982 Long term (current) use of aspirin: Secondary | ICD-10-CM | POA: Insufficient documentation

## 2016-09-16 DIAGNOSIS — Z87891 Personal history of nicotine dependence: Secondary | ICD-10-CM | POA: Diagnosis not present

## 2016-09-16 DIAGNOSIS — M545 Low back pain: Secondary | ICD-10-CM | POA: Diagnosis present

## 2016-09-16 DIAGNOSIS — J449 Chronic obstructive pulmonary disease, unspecified: Secondary | ICD-10-CM | POA: Insufficient documentation

## 2016-09-16 DIAGNOSIS — I10 Essential (primary) hypertension: Secondary | ICD-10-CM | POA: Insufficient documentation

## 2016-09-16 DIAGNOSIS — M546 Pain in thoracic spine: Secondary | ICD-10-CM | POA: Diagnosis not present

## 2016-09-16 LAB — CBC WITH DIFFERENTIAL/PLATELET
Basophils Absolute: 0.1 10*3/uL (ref 0–0.1)
Basophils Relative: 1 %
EOS ABS: 0.4 10*3/uL (ref 0–0.7)
EOS PCT: 5 %
HCT: 34.7 % — ABNORMAL LOW (ref 40.0–52.0)
Hemoglobin: 11.9 g/dL — ABNORMAL LOW (ref 13.0–18.0)
LYMPHS ABS: 1.1 10*3/uL (ref 1.0–3.6)
LYMPHS PCT: 15 %
MCH: 28.8 pg (ref 26.0–34.0)
MCHC: 34.3 g/dL (ref 32.0–36.0)
MCV: 84.1 fL (ref 80.0–100.0)
MONOS PCT: 17 %
Monocytes Absolute: 1.2 10*3/uL — ABNORMAL HIGH (ref 0.2–1.0)
Neutro Abs: 4.6 10*3/uL (ref 1.4–6.5)
Neutrophils Relative %: 62 %
PLATELETS: 391 10*3/uL (ref 150–440)
RBC: 4.13 MIL/uL — ABNORMAL LOW (ref 4.40–5.90)
RDW: 12.7 % (ref 11.5–14.5)
WBC: 7.4 10*3/uL (ref 3.8–10.6)

## 2016-09-16 LAB — TROPONIN I

## 2016-09-16 LAB — COMPREHENSIVE METABOLIC PANEL
ALT: 9 U/L — AB (ref 17–63)
ANION GAP: 9 (ref 5–15)
AST: 16 U/L (ref 15–41)
Albumin: 3.7 g/dL (ref 3.5–5.0)
Alkaline Phosphatase: 65 U/L (ref 38–126)
BUN: 8 mg/dL (ref 6–20)
CHLORIDE: 88 mmol/L — AB (ref 101–111)
CO2: 31 mmol/L (ref 22–32)
CREATININE: 0.61 mg/dL (ref 0.61–1.24)
Calcium: 9 mg/dL (ref 8.9–10.3)
Glucose, Bld: 113 mg/dL — ABNORMAL HIGH (ref 65–99)
Potassium: 3.5 mmol/L (ref 3.5–5.1)
SODIUM: 128 mmol/L — AB (ref 135–145)
Total Bilirubin: 0.6 mg/dL (ref 0.3–1.2)
Total Protein: 7.1 g/dL (ref 6.5–8.1)

## 2016-09-16 MED ORDER — MORPHINE SULFATE (PF) 2 MG/ML IV SOLN
2.0000 mg | Freq: Once | INTRAVENOUS | Status: AC
  Administered 2016-09-16: 2 mg via INTRAVENOUS

## 2016-09-16 MED ORDER — DICLOFENAC SODIUM 1 % TD GEL
2.0000 g | Freq: Once | TRANSDERMAL | Status: AC
  Administered 2016-09-16: 06:00:00 2 g via TOPICAL
  Filled 2016-09-16: qty 100

## 2016-09-16 MED ORDER — LIDOCAINE 5 % EX PTCH: 1.0000 | MEDICATED_PATCH | CUTANEOUS | Status: DC

## 2016-09-16 MED ORDER — LACTULOSE 10 GM/15ML PO SOLN
30.0000 g | Freq: Once | ORAL | Status: AC
  Administered 2016-09-16: 30 g via ORAL

## 2016-09-16 MED ORDER — MAGNESIUM CITRATE PO SOLN
1.0000 | Freq: Once | ORAL | Status: AC
  Administered 2016-09-16: 1 via ORAL

## 2016-09-16 MED ORDER — DOCUSATE SODIUM 100 MG PO CAPS
100.0000 mg | ORAL_CAPSULE | Freq: Once | ORAL | Status: AC
  Administered 2016-09-16: 100 mg via ORAL

## 2016-09-16 MED ORDER — ONDANSETRON HCL 4 MG/2ML IJ SOLN
4.0000 mg | Freq: Once | INTRAMUSCULAR | Status: AC
  Administered 2016-09-16: 4 mg via INTRAVENOUS

## 2016-09-16 MED ORDER — LACTULOSE 10 GM/15ML PO SOLN: 20.0000 g | Freq: Every day | ORAL | 0 refills | Status: DC | PRN

## 2016-09-16 NOTE — ED Notes (Signed)
Patient transported to X-ray 

## 2016-09-16 NOTE — ED Notes (Signed)
Soap suds enema completed. Pt cleaned up and resting on side. Pt informed to try and hold in for as long as possible before passing stool. Pt 's bed next to toilet and informed to call out when needing assistance to toilet.

## 2016-09-16 NOTE — ED Notes (Signed)
Dr. Mable Paris notified of patients pain and heart rate.

## 2016-09-16 NOTE — Discharge Instructions (Signed)
1. You may continue your regimen of Oxycodone + Tylenol for your back pain. 2. Take lactulose as needed for bowel movements. 3. Start a daily stool softener. 4. Return to the ER for worsening symptoms, persistent vomiting, difficulty breathing or other concerns.

## 2016-09-16 NOTE — ED Notes (Signed)
Patient states, "I was able to move my bowels. I am feeling a little better".

## 2016-09-16 NOTE — ED Provider Notes (Signed)
Second troponin negative. The patient was able to have a bowel movement. He is discharged home according to Dr. Asher Muir plan.   Darel Hong, MD 09/16/16 [phone]
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
## 2016-09-16 NOTE — ED Notes (Signed)
Patient assisted to commode. Call light within reach.

## 2016-09-16 NOTE — ED Triage Notes (Signed)
Pt arrived to ED with c/o upper back pain x3 weeks. Pt diagnosed with lung cancer last year, pt had left lobectomy in May 2016, pt recently seen by PCP for back pain and PCP referred pt back to oncologist at [place] due to the right lung showing signs of cancer. Pt has been taking oxycodone HCL 5mg . Pt taken to [facility] 4. RN made aware.

## 2016-09-16 NOTE — ED Notes (Signed)
Pt back from X-ray.  

## 2016-09-20 ENCOUNTER — Other Ambulatory Visit: Payer: Self-pay | Admitting: Family Medicine

## 2016-09-20 ENCOUNTER — Telehealth: Payer: Self-pay | Admitting: *Deleted

## 2016-09-20 MED ORDER — OXYCODONE HCL 5 MG PO TABS: 5.0000 mg | ORAL_TABLET | ORAL | 0 refills | Status: AC | PRN

## 2016-09-20 NOTE — Telephone Encounter (Signed)
Spoke with Dr. Caryl Bis concerning patient has only enough pain medication for today and that patient pain is rated at 8 on scale of 0-10,  Dr. Caryl Bis after reviewing [place] Controlled substance web site filled a 6 day supply for patient for Oxycodone IR. Patient has HX of lung cancer and now with new mass , in right lung awaiting biopsy report,has been experiencing increased pain in right posterior lung and under the arm area.  Will discuss with PCP when back in office does patient need to be set up with pain clinic?

## 2016-09-20 NOTE — Telephone Encounter (Addendum)
Daughter called stating that pt is in severe pain. Patient is having pain in his back. Pt has been given 5 days of pain medication, however daughter is requesting more pain medication until his diagnosis.  Contact Daughter Shirlean Mylar [phone]

## 2016-09-22 ENCOUNTER — Other Ambulatory Visit: Payer: Self-pay | Admitting: Internal Medicine

## 2016-09-22 ENCOUNTER — Telehealth: Payer: Self-pay | Admitting: Internal Medicine

## 2016-09-22 NOTE — Telephone Encounter (Signed)
I think that you have another message on the as well.

## 2016-09-22 NOTE — Telephone Encounter (Signed)
Pt daughter called follow up on message. Daughter is really concerned about her dad. Please advise?

## 2016-09-22 NOTE — Telephone Encounter (Signed)
Pt daughter called and stated that the pt is still in a lot of pain, the medication is not helping him. He has been taking them over 1 month and it feels as though the pain is coming back quicker. Is there anything else that we could do for the pain? Daughter was wondering about maybe edible marijuana, or even a pain patch. Please advise, thank you!  Call Robin @ [phone]

## 2016-09-22 NOTE — Telephone Encounter (Signed)
Called Dr Lissa Hoard office and explained the situation.  I also called Mr Adrian Cohen.  Dr Lianne Moris called him this am.  His biopsy did come back as cancer.  They are planning to see him Friday for treatment.  Dr Lianne Moris is sending him in a different pain medication.

## 2016-09-22 NOTE — Telephone Encounter (Signed)
Called Dr Lissa Hoard office to explain Mr Adrian Cohen's situation.  Also called Mr Adrian Cohen.  He states he spoke to Dr Lianne Moris today.  Was notified biopsy was positive for cancer.  Dr Lianne Moris was sending him in another pain medication to take with plans for f/u on Friday and start treatment.

## 2016-09-24 NOTE — Telephone Encounter (Signed)
Error

## 2016-09-28 ENCOUNTER — Emergency Department: Payer: Medicare PPO

## 2016-09-28 ENCOUNTER — Telehealth: Payer: Self-pay | Admitting: Internal Medicine

## 2016-09-28 ENCOUNTER — Emergency Department
Admission: EM | Admit: 2016-09-28 | Discharge: 2016-09-28 | Disposition: A | Discharge status: Home / Self Care | Payer: Medicare PPO | Attending: Emergency Medicine | Admitting: Emergency Medicine

## 2016-09-28 ENCOUNTER — Other Ambulatory Visit: Payer: Self-pay

## 2016-09-28 ENCOUNTER — Encounter: Payer: Self-pay | Admitting: Emergency Medicine

## 2016-09-28 DIAGNOSIS — I10 Essential (primary) hypertension: Secondary | ICD-10-CM | POA: Diagnosis not present

## 2016-09-28 DIAGNOSIS — R55 Syncope and collapse: Secondary | ICD-10-CM

## 2016-09-28 DIAGNOSIS — Z87891 Personal history of nicotine dependence: Secondary | ICD-10-CM | POA: Insufficient documentation

## 2016-09-28 DIAGNOSIS — K5903 Drug induced constipation: Secondary | ICD-10-CM

## 2016-09-28 DIAGNOSIS — Z85118 Personal history of other malignant neoplasm of bronchus and lung: Secondary | ICD-10-CM | POA: Diagnosis not present

## 2016-09-28 DIAGNOSIS — K5900 Constipation, unspecified: Secondary | ICD-10-CM | POA: Diagnosis present

## 2016-09-28 DIAGNOSIS — J449 Chronic obstructive pulmonary disease, unspecified: Secondary | ICD-10-CM | POA: Insufficient documentation

## 2016-09-28 HISTORY — DX: Malignant (primary) neoplasm, unspecified: C80.1

## 2016-09-28 LAB — CBC WITH DIFFERENTIAL/PLATELET
Basophils Absolute: 0.1 10*3/uL (ref 0–0.1)
Basophils Relative: 1 %
Eosinophils Absolute: 0.3 10*3/uL (ref 0–0.7)
Eosinophils Relative: 4 %
HEMATOCRIT: 33.5 % — AB (ref 40.0–52.0)
HEMOGLOBIN: 11.5 g/dL — AB (ref 13.0–18.0)
LYMPHS PCT: 13 %
Lymphs Abs: 1.2 10*3/uL (ref 1.0–3.6)
MCH: 28.7 pg (ref 26.0–34.0)
MCHC: 34.2 g/dL (ref 32.0–36.0)
MCV: 83.8 fL (ref 80.0–100.0)
MONOS PCT: 12 %
Monocytes Absolute: 1.1 10*3/uL — ABNORMAL HIGH (ref 0.2–1.0)
NEUTROS PCT: 70 %
Neutro Abs: 6.7 10*3/uL — ABNORMAL HIGH (ref 1.4–6.5)
Platelets: 551 10*3/uL — ABNORMAL HIGH (ref 150–440)
RBC: 4 MIL/uL — AB (ref 4.40–5.90)
RDW: 12.5 % (ref 11.5–14.5)
WBC: 9.5 10*3/uL (ref 3.8–10.6)

## 2016-09-28 LAB — BASIC METABOLIC PANEL
ANION GAP: 9 (ref 5–15)
BUN: 10 mg/dL (ref 6–20)
CHLORIDE: 90 mmol/L — AB (ref 101–111)
CO2: 26 mmol/L (ref 22–32)
Calcium: 8.5 mg/dL — ABNORMAL LOW (ref 8.9–10.3)
Creatinine, Ser: 0.7 mg/dL (ref 0.61–1.24)
GFR calc Af Amer: >60 mL/min (ref >60)
GFR calc non Af Amer: >60 mL/min (ref >60)
GLUCOSE: 122 mg/dL — AB (ref 65–99)
POTASSIUM: 4.7 mmol/L (ref 3.5–5.1)
Sodium: 125 mmol/L — ABNORMAL LOW (ref 135–145)

## 2016-09-28 LAB — GLUCOSE, CAPILLARY: Glucose-Capillary: 115 mg/dL — ABNORMAL HIGH (ref 65–99)

## 2016-09-28 MED ORDER — LACTULOSE 10 GM/15ML PO SOLN: 30.0000 g | Freq: Every day | ORAL | 0 refills | Status: AC | PRN

## 2016-09-28 MED ORDER — LACTULOSE 10 GM/15ML PO SOLN
30.0000 g | Freq: Once | ORAL | Status: AC
  Administered 2016-09-28: 30 g via ORAL
  Filled 2016-09-28: qty 60

## 2016-09-28 MED ORDER — GLYCERIN (LAXATIVE) 2.1 G RE SUPP
1.0000 | Freq: Once | RECTAL | Status: AC
  Administered 2016-09-28: 1 via RECTAL
  Filled 2016-09-28: qty 1

## 2016-09-28 MED ORDER — KETOROLAC TROMETHAMINE 30 MG/ML IJ SOLN
30.0000 mg | Freq: Once | INTRAMUSCULAR | Status: AC
  Administered 2016-09-28: 30 mg via INTRAVENOUS
  Filled 2016-09-28: qty 1

## 2016-09-28 MED ORDER — METHYLNALTREXONE BROMIDE 12 MG/0.6ML ~~LOC~~ SOLN
12.0000 mg | Freq: Once | SUBCUTANEOUS | Status: AC
  Administered 2016-09-28: 12 mg via SUBCUTANEOUS
  Filled 2016-09-28: qty 0.6

## 2016-09-28 MED ORDER — SENNOSIDES-DOCUSATE SODIUM 8.6-50 MG PO TABS: 1.0000 | ORAL_TABLET | Freq: Once | ORAL | Status: DC

## 2016-09-28 MED ORDER — SODIUM CHLORIDE 0.9 % IV BOLUS (SEPSIS)
1000.0000 mL | Freq: Once | INTRAVENOUS | Status: AC
  Administered 2016-09-28: 1000 mL via INTRAVENOUS

## 2016-09-28 NOTE — Telephone Encounter (Signed)
Patient has been constipated for 2 weeks. Taking colace every day. Has not tried any laxatives. Was given lactulose in the ED on 09/16/2016 and he said that it was not effective. He stated he has has a soapy water enema before and those have worked. Please advise.

## 2016-09-28 NOTE — ED Notes (Signed)
Pt states he was seen here recently for constipation and had an enema done. Pt states Friday or Saturday passed small hard stool. States he takes oxycotin for pain, states takes colace as well. Pt appears uncomfortable. Pt states hx lung CA. No chemo or radiation yet. States has CT Head scheduled tomorrow prior to starting treatment for lung CA.

## 2016-09-28 NOTE — ED Notes (Signed)
Pt had 1 BM prior to departure. Pt tolerated going to BR well with assistance.

## 2016-09-28 NOTE — ED Notes (Signed)
Per EDP to Anda Kraft, RN pt can be discharged at this time.

## 2016-09-28 NOTE — ED Notes (Signed)

## 2016-09-28 NOTE — Telephone Encounter (Signed)
Patient states he is going to the ED to be disimpacted.

## 2016-09-28 NOTE — Discharge Instructions (Addendum)
To prevent constipation, decrease the quantity of opioid medications or taking, drink plenty of fluid, and stay active. In addition, you may continue your stool softener, and take lactulose as needed for constipation.  Return to the emergency department if you develop severe abdominal pain, nausea or vomiting, fever or chills, or any other symptoms concerning to you.

## 2016-09-28 NOTE — ED Notes (Signed)
light green recollect, Anda Kraft, RN aware

## 2016-09-28 NOTE — ED Provider Notes (Signed)
-----------------------------------------   8:45 PM on 09/28/2016 -----------------------------------------  Patient was discharged per previous MD. I was informed by nursing that patient appeared altered. On my examination of the patient he was moaning in pain, was diaphoretic, and appeared slightly confused although when I started asking him questions he was a/ox3.  Patient had just had a bowel movement and was reporting that he had continued rectal pain and continued to defecate as he was lying in the stretcher. He denied any other acute symptoms. Patient was placed on a monitor and vital signs are stable. EKG shows sinus tachycardia with PACs and no other acute significant findings. Overall suspect the patient had a vasovagal episode related to his bowel movement. Patient denies any other pain in his abdomen is soft and nontender. I will give patient fluids, obtain basic labs, keep patient on the monitor, and the discharge is canceled for now. If patient's symptoms improve, lab workup is unremarkable, and he feels well,  he still may be able to be discharged.  ED ECG REPORT I, Arta Silence, the attending physician, personally viewed and interpreted this ECG.  Date: 09/28/2016 EKG Time: 2044 Rate: 98 Rhythm: normal sinus rhythm with PACs QRS Axis: normal Intervals: normal ST/T Wave abnormalities: early repolarization V2 and V3, no ischemic changes Narrative Interpretation: no evidence of acute ischemia  ----------------------------------------- 11:12 PM on 09/28/2016 -----------------------------------------  Vital signs remain stable and he states that he feels much better. He is sitting comfortably in bed watching TV.  Lab workup has no acute findings; patient is chronically hyponatremic and his number today is unchanged from prior labs. He states he wants to go home and states he is returning tomorrow morning to follow-up in the clinic. Return precautions given.   Arta Silence, MD 09/29/16 [phone]

## 2016-09-28 NOTE — Telephone Encounter (Signed)
If has been two weeks, ok for enema.  If no results my repeat x 1.  Once has bowel movement, start miralax daily.

## 2016-09-28 NOTE — ED Triage Notes (Signed)
Arrives with c/o constipation.  States last BM last week.  Seen through ED a couple of weeks ago for same complaint, but symptoms have returned.

## 2016-09-28 NOTE — Telephone Encounter (Signed)
Pt daughter called and was wondering if Dr. Nicki Reaper could prescribe something for opioid constipation. Pt has been constipated for about 1 month. Please advise, thank you!  Call Robin @ [phone]

## 2016-09-28 NOTE — ED Notes (Signed)
Dr. Mariea Clonts at bedside.

## 2016-09-28 NOTE — ED Notes (Signed)
Signature pad not responding, pt signed hard copy of ED discharge and placed on chart.

## 2016-09-28 NOTE — Telephone Encounter (Signed)
Left message to return call to office.
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
## 2016-09-28 NOTE — ED Notes (Signed)
This RN in to pt's room to answer call light. Pt found unresponsive, slumped over to his right side on the bedside toliet, extremely diaphoretic, with cold, clammy skin. Pt required 2 person assist (myself and Mayra, EDT) to get back into bed. Dr Cherylann Banas notified and responded to room immediately to assess pt. New orders for EKG, labs, and IV fluids to be entered by EDP and carried out by this RN. Anda Kraft, primary RN, made aware of change in pt condition and EDP plan of care.

## 2016-09-30 ENCOUNTER — Telehealth: Payer: Self-pay | Admitting: Internal Medicine

## 2016-09-30 ENCOUNTER — Telehealth: Payer: Self-pay | Admitting: *Deleted

## 2016-09-30 NOTE — Telephone Encounter (Signed)
Please notify, Actually spoke to pts oncologist.  Explained the situation with the increased pain.  Per pts daughter, pt is having an increased amount of pain and daughter wants him admitted to help control the pain.  Has known lung cancer.  Discussed care with his oncologist.  They are going to try and see if they can get him directly admitted to [facility] for pain control while waiting for the remainder of the test.  They are going to contact him with this information.  They will let me know if we need to do anything from our end (i,e., get admitted here, etc).

## 2016-09-30 NOTE — Telephone Encounter (Signed)
See other message for discussion with his oncologist.

## 2016-09-30 NOTE — Telephone Encounter (Signed)
Patient daughter has been notified ,

## 2016-09-30 NOTE — Telephone Encounter (Signed)
Pt's son Royer Cristobal , requested a call. He has concerns of his father's pain. Pt has chemo/radiation treatments starts Sept 5, 2018. Pt's son has concern of his father pain and requested Dr. Nicki Reaper have pt admitted into the hospital prior to pt treatment. Pt is becoming incontinent, fatigue and has trouble walking . Please contact Shirlean Mylar [phone]  *transferred to nurse line for server pain and trouble walking

## 2016-09-30 NOTE — Telephone Encounter (Signed)
A nurse from the nurse line called back and stated that she advised patient to go to the ER but pt refused. Please call daughter Shirlean Mylar.

## 2016-09-30 NOTE — Telephone Encounter (Signed)
Spoke with patients daughter Shirlean Mylar about her fathers condition. Advised her to take patient to ED at [facility] to be admitted and speak with oncologist to get pain under control. She voiced understanding that PCP no longer manages pain and cannot admit to hospital from office.

## 2016-09-30 NOTE — Telephone Encounter (Signed)
Left message for Adrian Cohen to return call to office.

## 2016-09-30 NOTE — Telephone Encounter (Signed)
FYI.  We have already discussed this patient just wanted to make you aware.

## 2016-09-30 NOTE — Telephone Encounter (Signed)
Patient Name: Adrian Cohen  DOB: [birth year]    Initial Comment Caller states his dad is having severe pain in his back, he has recently been dx w/ lung cancer. He has chemo coming up on 9/5.    Nurse Assessment  Nurse: Raphael Gibney, RN, Vanita Ingles Date/Time (Eastern Time): 09/30/2016 8:55:00 AM  Confirm and document reason for call. If symptomatic, describe symptoms. ---Caller states he was recently diagnosed with lung cancer. Went to the ER recently for back pain. He is supposed to have treatment next week. Pain is all over in his back. He has been incontinent of stool and urine. He is spitting up blood.  Does the patient have any new or worsening symptoms? ---Yes  Will a triage be completed? ---Yes  Related visit to physician within the last 2 weeks? ---Yes  Does the PT have any chronic conditions? (i.e. diabetes, asthma, etc.) ---Yes  List chronic conditions. ---lung cancer  Is this a behavioral health or substance abuse call? ---No     Guidelines    Guideline Title Affirmed Question Affirmed Notes  Back Pain [1] Urinary or bowel incontinence (i.e., loss of bladder or bowel control) AND [2] new onset    Final Disposition User   Go to ED Now Raphael Gibney, RN, Vanita Ingles    Comments  pt does not want want to go to the ER as he has been 3 times and he says that they just let you lay there.  called back line and spoke to [place] and gave report that pt is having a lot of back pain. Pain medication is not helping. Recently diagnosed with lung cancer Incontinent of stool and urine. Spitting up blood. Triage outcome of go to ER now but he does not want to go Call his daughter Shirlean Mylar back at  called back line and spoke to [place] and gave report that pt is having a lot of back pain. Pain medication is not helping. Recently diagnosed with lung cancer Incontinent of stool and urine. Spitting up blood. Triage outcome of go to ER now but he does not want to go Call his daughter Shirlean Mylar back at [phone]   Referrals   GO TO FACILITY REFUSED   Disagree/Comply: Disagree  Disagree/Comply Reason: Disagree with instructions

## 2016-11-01 DEATH — deceased

## 2016-11-23 ENCOUNTER — Telehealth: Payer: Self-pay | Admitting: Internal Medicine

## 2016-11-23 NOTE — Telephone Encounter (Signed)
Pt daughter called and stated that pt passed away on 2016-10-31.

## 2016-11-23 NOTE — Telephone Encounter (Signed)
FYI

## 2016-12-09 ENCOUNTER — Ambulatory Visit: Payer: Medicare Other

## 2016-12-21 ENCOUNTER — Ambulatory Visit: Payer: Medicare PPO | Admitting: Internal Medicine

## 2017-06-30 ENCOUNTER — Encounter (INDEPENDENT_AMBULATORY_CARE_PROVIDER_SITE_OTHER): Payer: Medicare PPO

## 2017-06-30 ENCOUNTER — Ambulatory Visit (INDEPENDENT_AMBULATORY_CARE_PROVIDER_SITE_OTHER): Payer: Medicare PPO | Admitting: Vascular Surgery

## 2019-01-17 IMAGING — DX DG CHEST 2V
2 series · 2 of 2 positions shown · non-contrast
Comparison: 01/01/2016

CLINICAL DATA: Congestion, cough, blood-tinged mucus, history COPD,
hypertension, lung cancer

EXAM:
CHEST  2 VIEW

[chest pa]
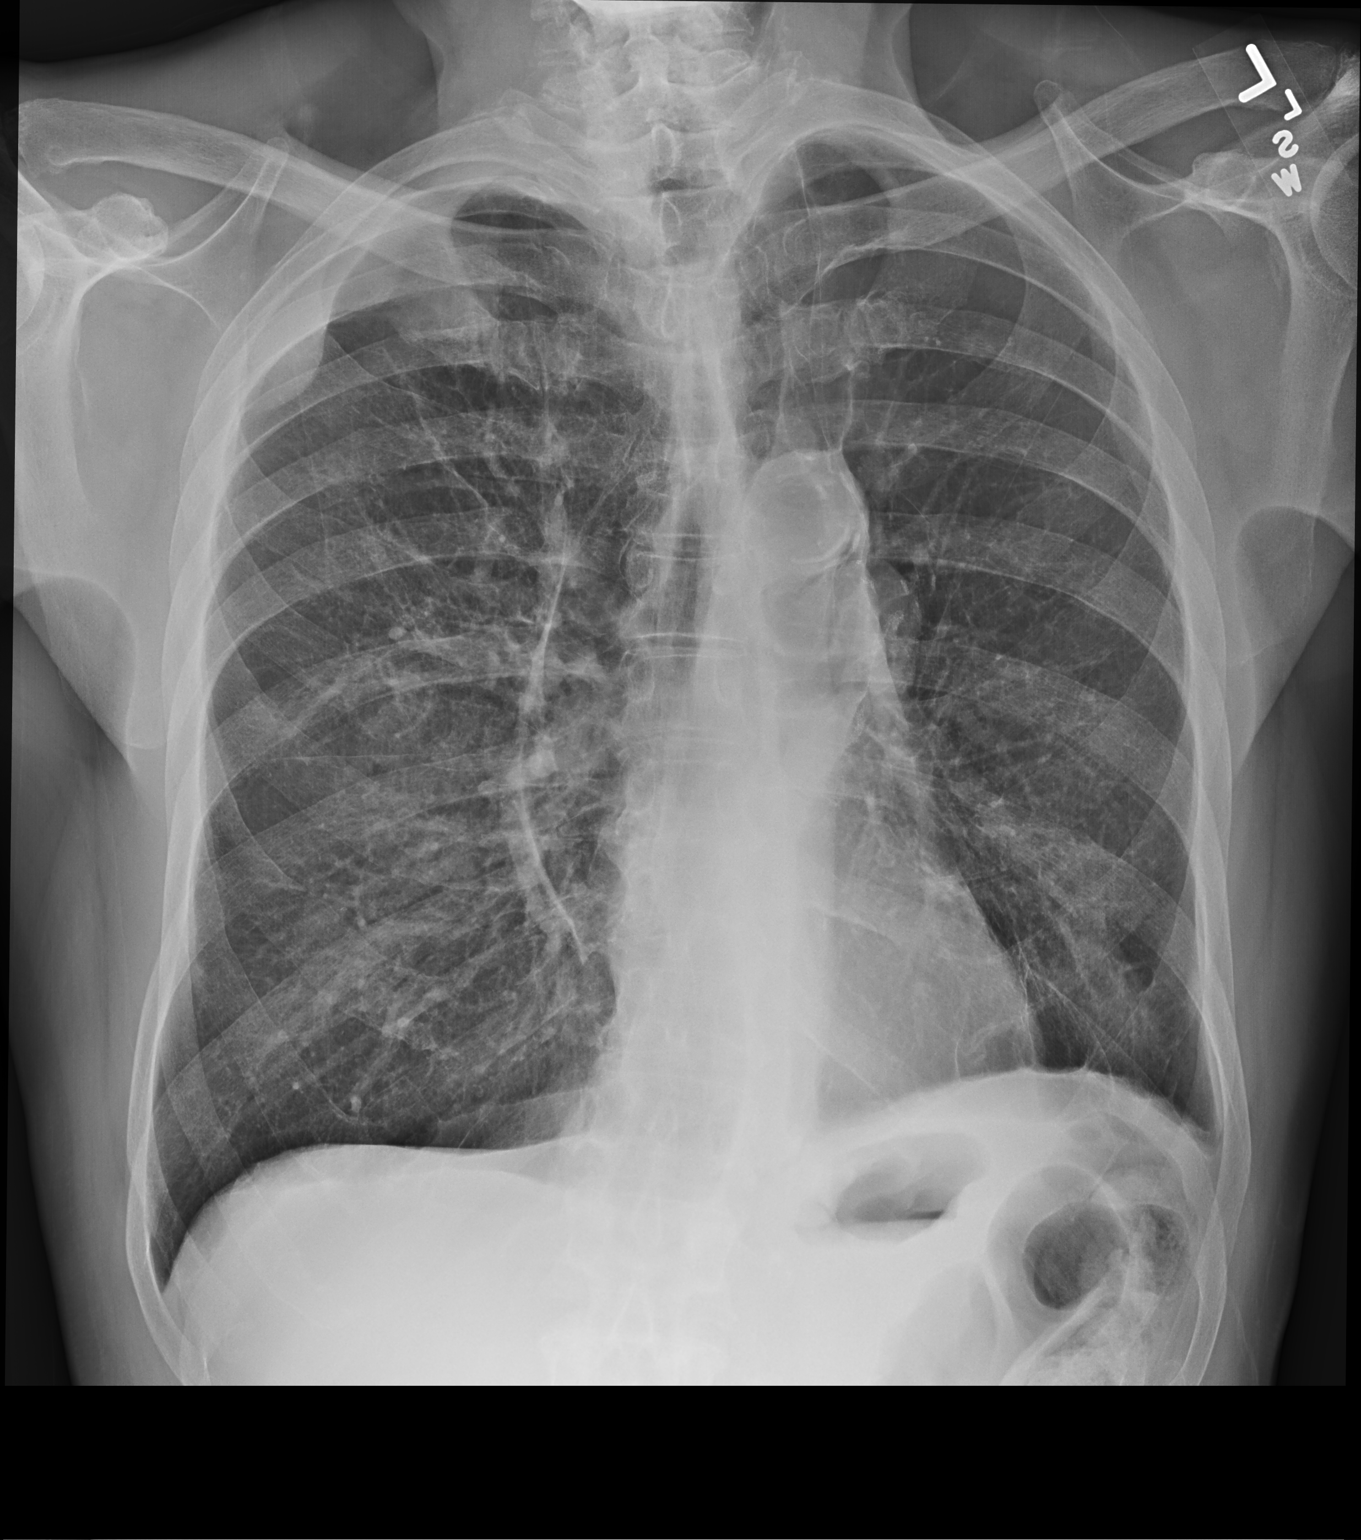

[chest lat]
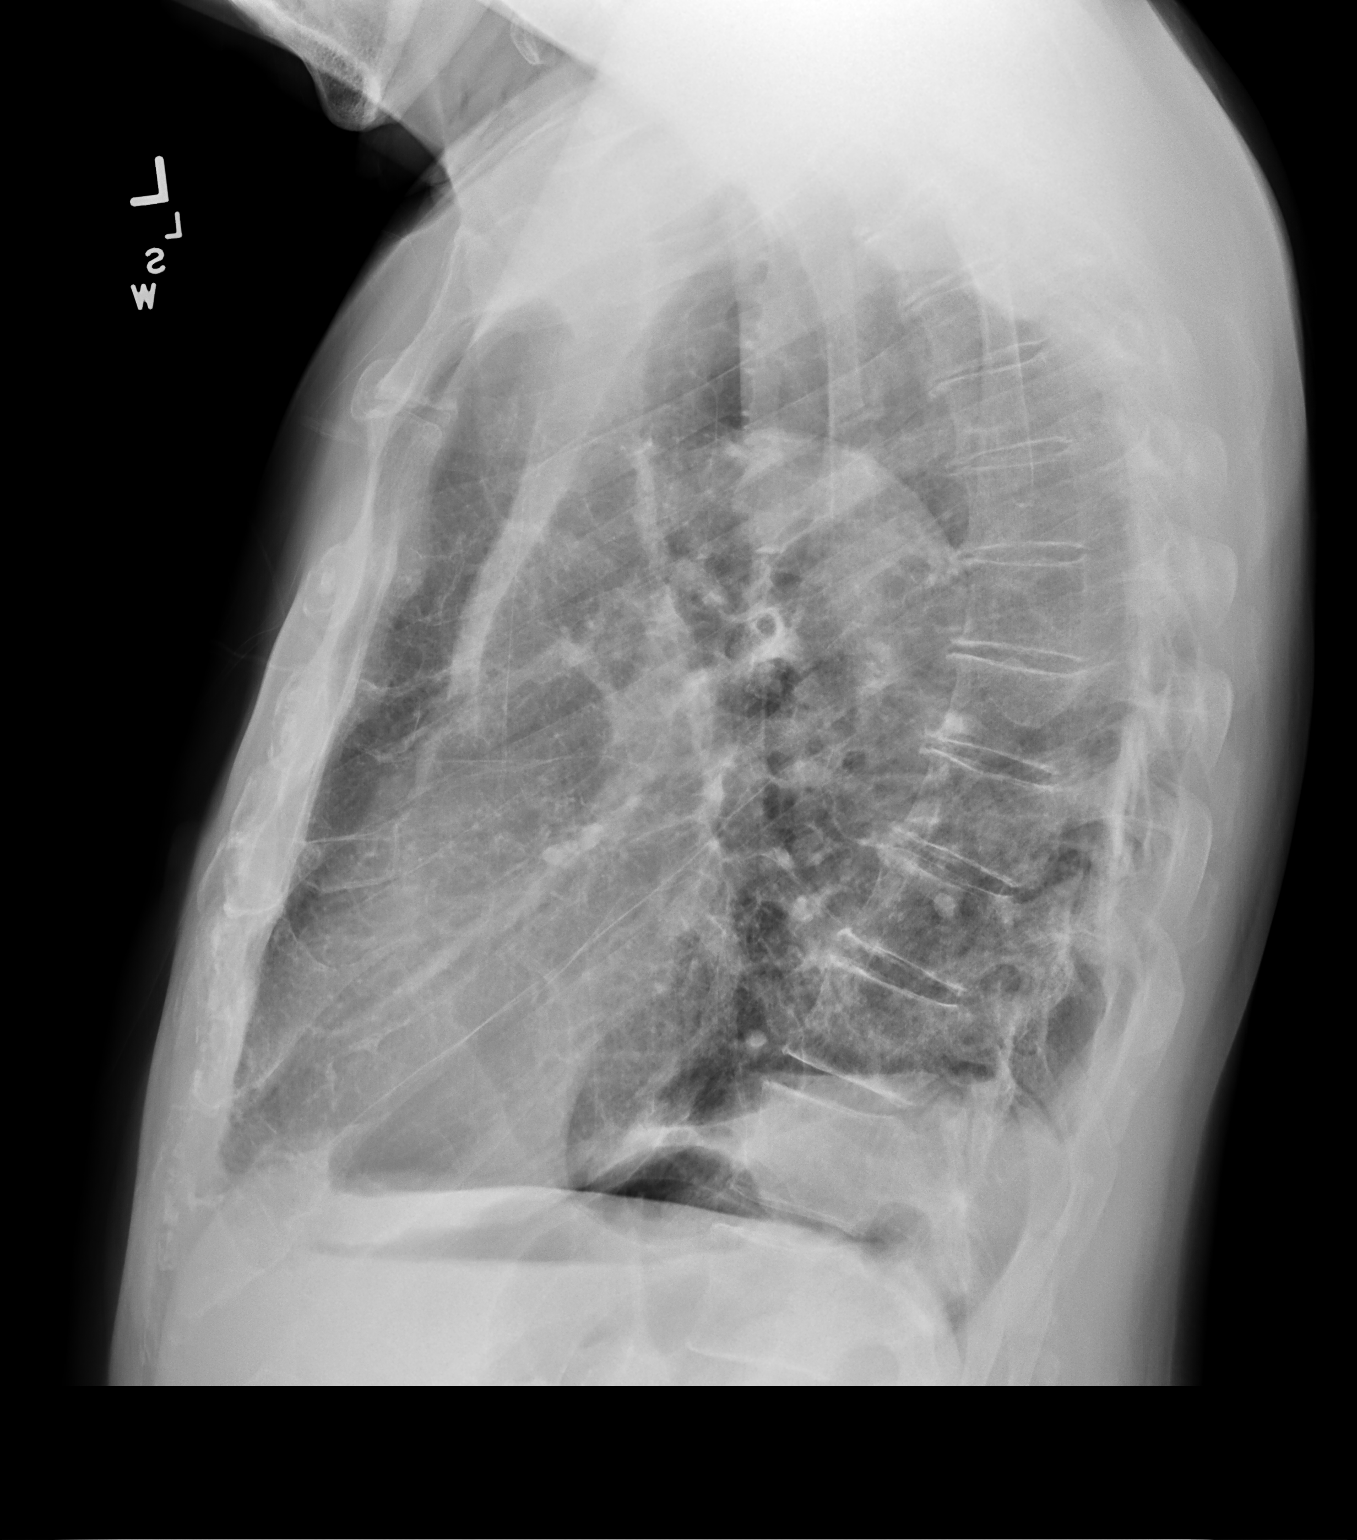

[2 of 2 positions shown; findings below may reference images not displayed]

FINDINGS: Normal heart size, mediastinal contours, and pulmonary vascularity.

Atherosclerotic calcification aorta.

Emphysematous and minimal bronchitic changes consistent with COPD.

Bullous disease particularly at LEFT apex with evidence of prior
LEFT lung resection.

New pleural-based opacity at the RIGHT apex with an additional area
of masslike opacification at RIGHT apex question 3.5 cm diameter
highly concerning for tumor.

No acute infiltrate, pleural effusion, or pneumothorax.

Bones appear demineralized.
IMPRESSION: Severe COPD changes with postsurgical changes in the LEFT chest
question prior LEFT upper lobectomy.

New pleural-based opacity at RIGHT apex with an additional 3.5 cm
diameter area of upper lobe opacity highly concerning for pulmonary
neoplasm; CT chest with contrast recommended for further evaluation.

Aortic Atherosclerosis (5UB7X-4SR.R) and Emphysema ([id number]-RMB.N).

These results will be called to the ordering clinician or
representative by the Radiologist Assistant, and communication
documented in the PACS or zVision Dashboard.

## 2019-02-08 IMAGING — CR DG CHEST 2V
1 series · 2 of 2 positions shown · non-contrast
Comparison: 08/19/2016 as well as CT of the chest on 01/24/2015

CLINICAL DATA: Chest and back pain, shortness of breath and
hematemesis.

EXAM:
CHEST  2 VIEW

[Series 1: dg chest 2 view · 0.14mm/px · 2 of 2 slices shown]
[im 1/2]
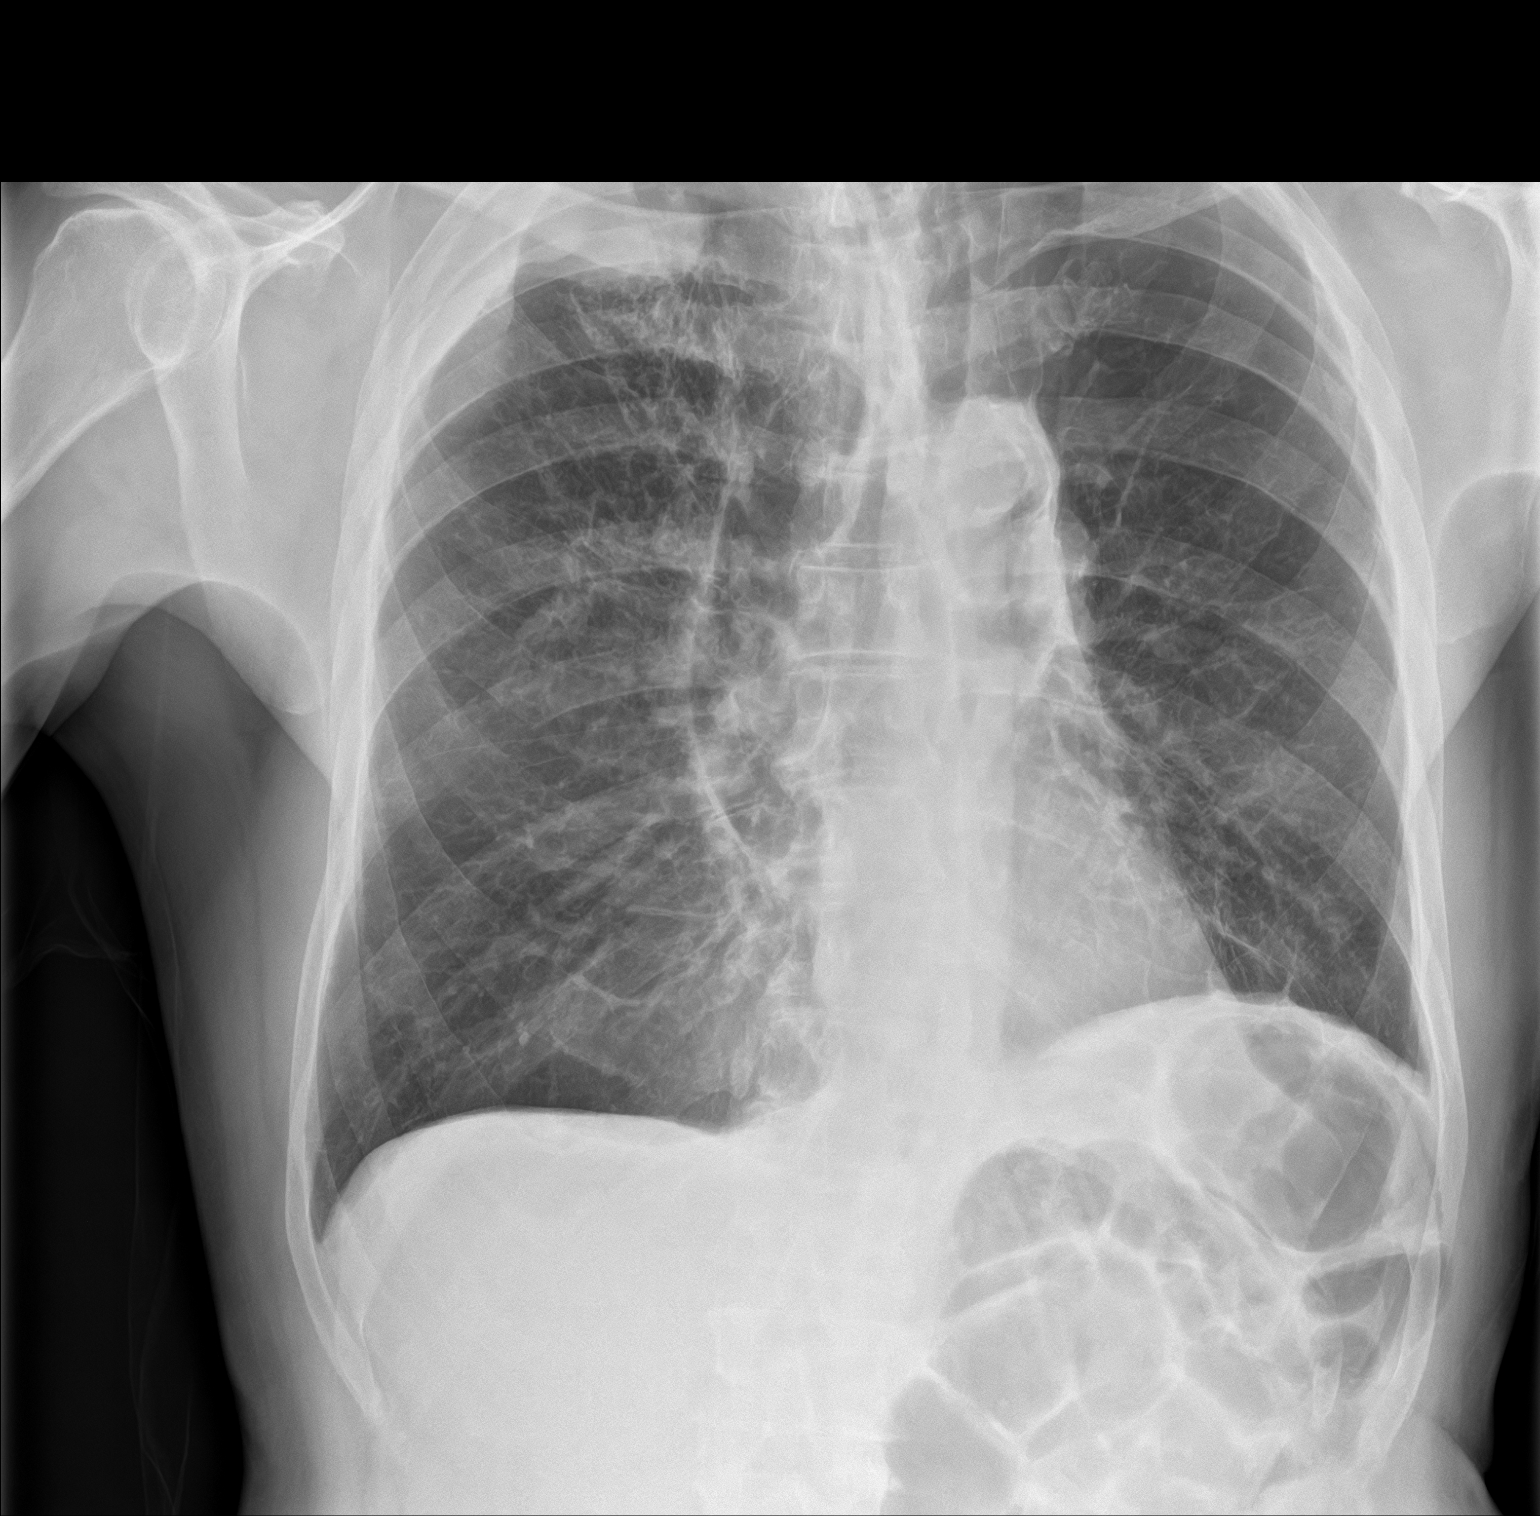
[im 2/2]
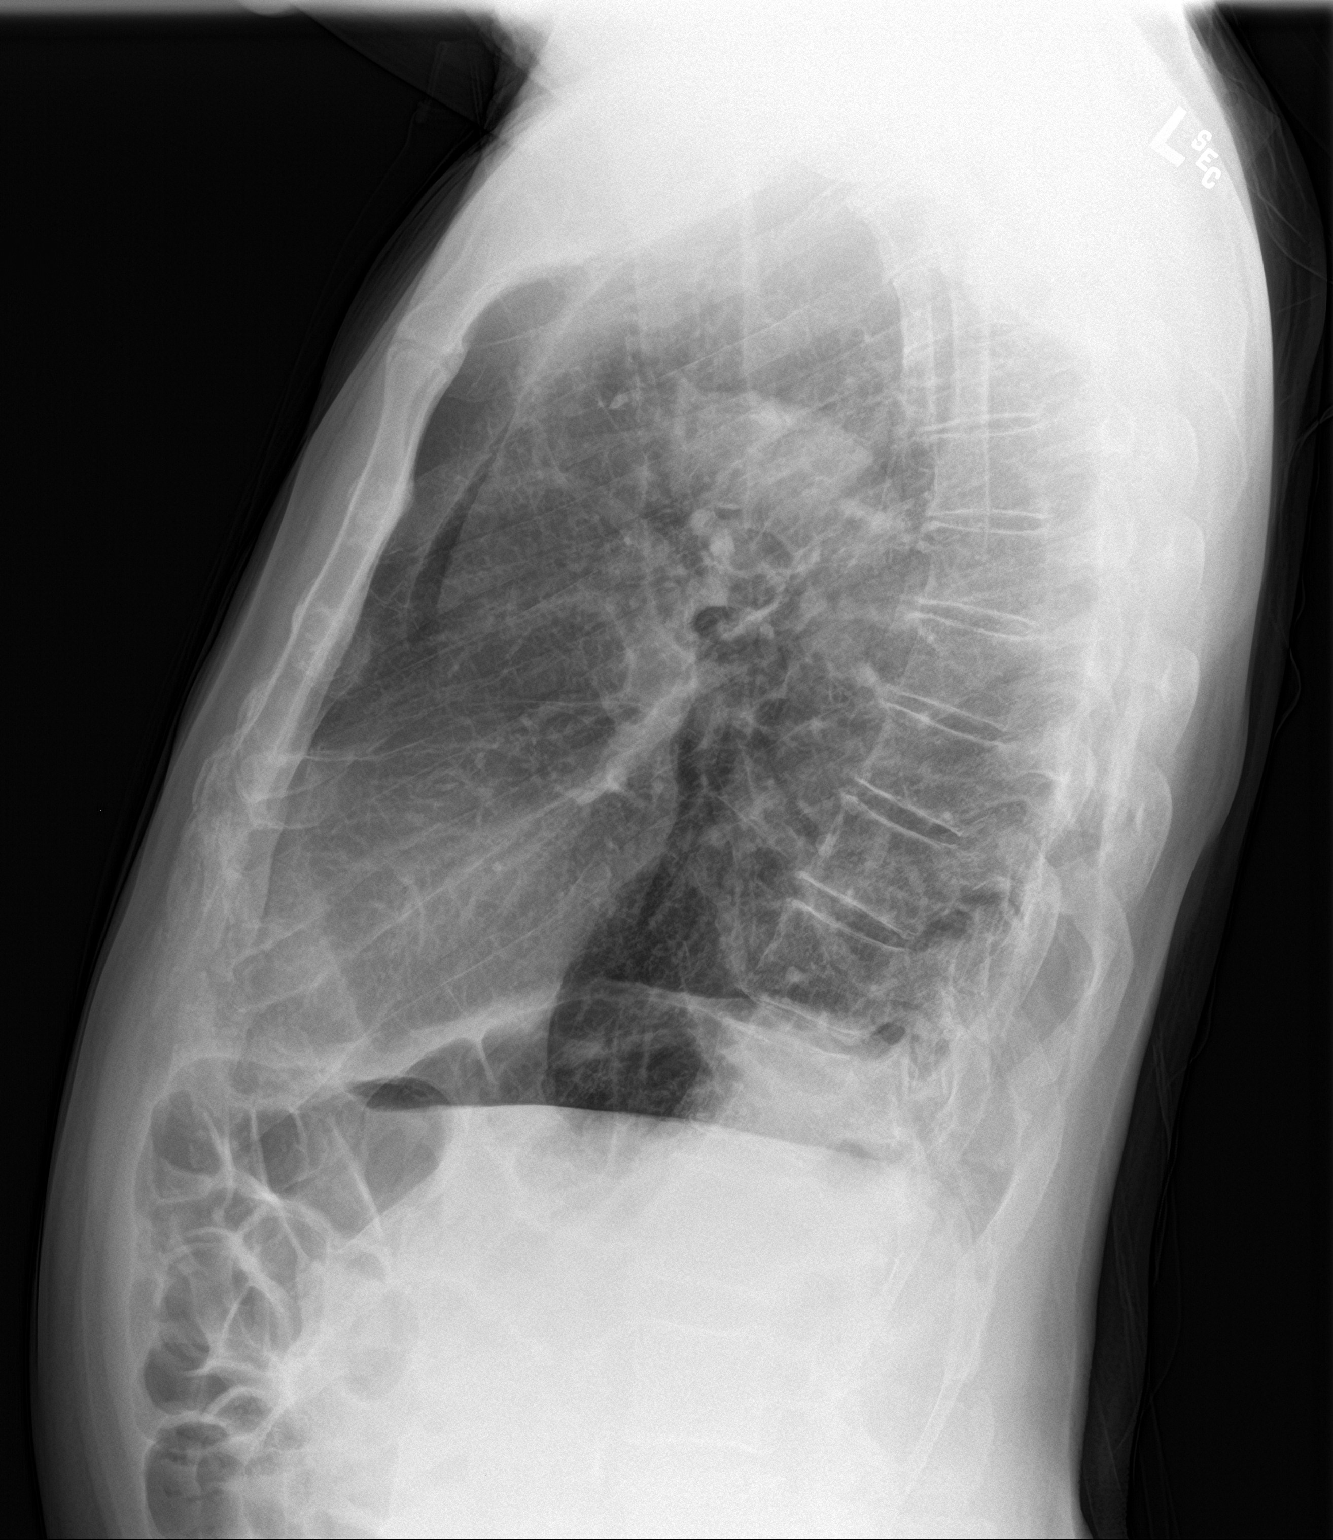

[2 of 2 positions shown; findings below may reference images not displayed]

FINDINGS: Severe emphysematous lung disease is again noted with multiple large
emphysematous blebs primarily along the medial aspects of both
lungs. These blebs abut the mediastinum and simulate the presence of
pneumomediastinum, especially adjacent to the aortic knob and region
of the main pulmonary artery and AP window. It would be extremely
difficult to exclude pneumomediastinum by chest x-ray and
correlation is suggested with clinical symptoms. CT of the chest
would be needed if there is any concern for esophageal perforation
or rupture of a blend into the mediastinum causing acute symptoms.

No evidence of pneumothorax. No edema, airspace consolidation or
pleural fluid is identified. The heart size is within normal limits.
The bony thorax is unremarkable.
IMPRESSION: Severe emphysematous lung disease with large emphysematous blebs,
primarily along the medial aspects of both lungs and abutting the
mediastinum by prior CT. It would be extremely difficult to exclude
pneumomediastinum by chest x-ray. CT of the chest is recommended if
there is any concern for acute pneumomediastinum due to rupture of a
bleb or esophageal perforation.

## 2019-02-08 IMAGING — CT CT ANGIO CHEST
2 of 6 series · 17 of 46 positions shown · IV contrast (APPLIED)
Comparison: Chest CT from 01/24/2015

CLINICAL DATA: History lung cancer with low back pain x3 weeks.
Blood tinged phlegm with cough.

EXAM:
CT ANGIOGRAPHY CHEST WITH CONTRAST
TECHNIQUE: Multidetector CT imaging of the chest was performed using the
standard protocol during bolus administration of intravenous
contrast. Multiplanar CT image reconstructions and MIPs were
obtained to evaluate the vascular anatomy.
CONTRAST:  75 cc Isovue 370 IV

[Series 5: thins · axial · 0.68mm/px · z∈[-326,-60]mm · 15 of 292 slices shown]
[im 13/292  lung]
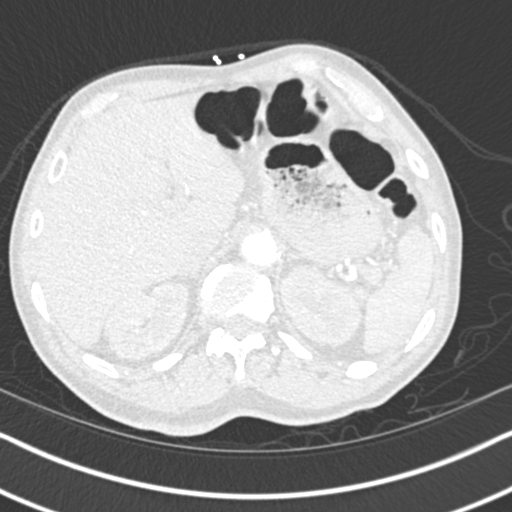
[im 38/292  soft-tissue]
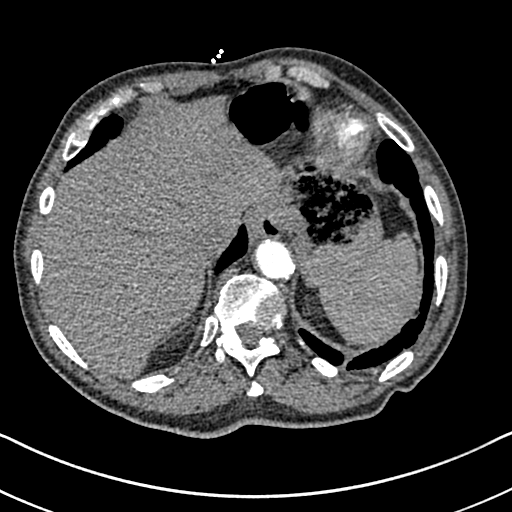
[im 51/292  lung]
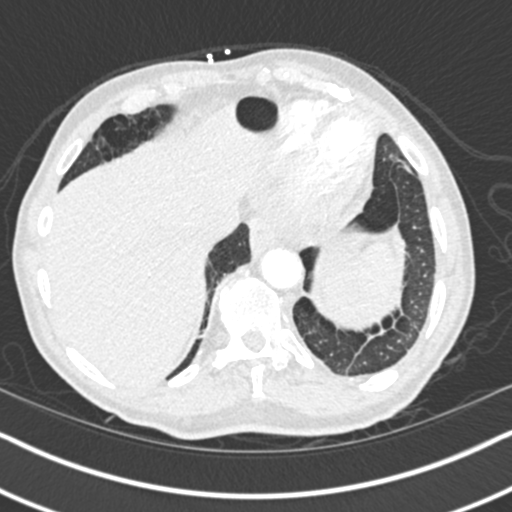
[im 76/292  soft-tissue]
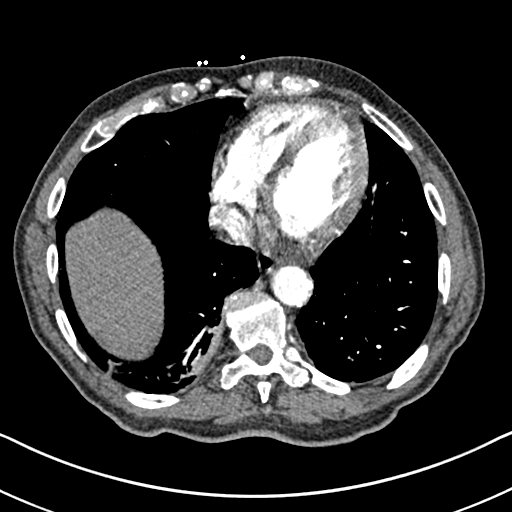
[im 89/292  lung]
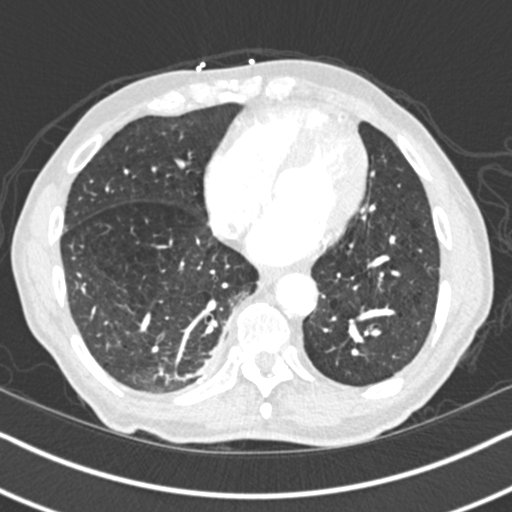
[im 114/292  soft-tissue]
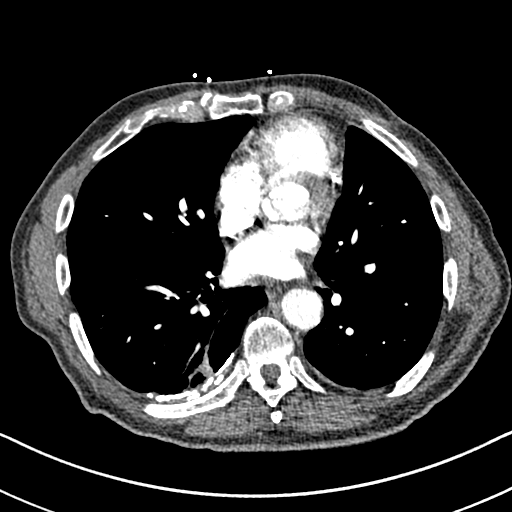
[im 127/292  lung]
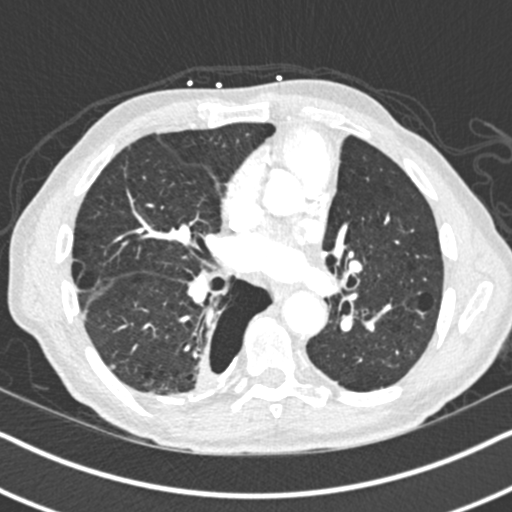
[im 152/292  soft-tissue]
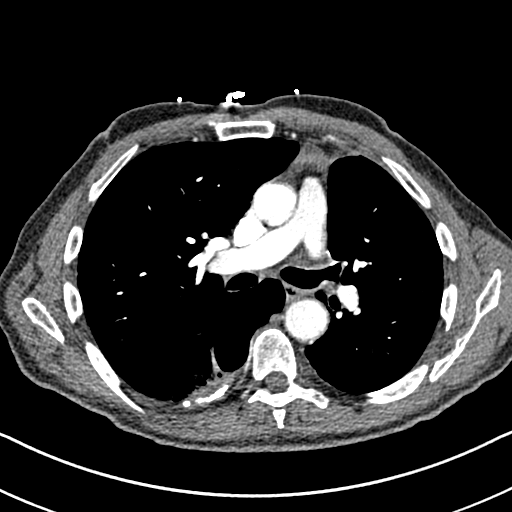
[im 165/292  lung]
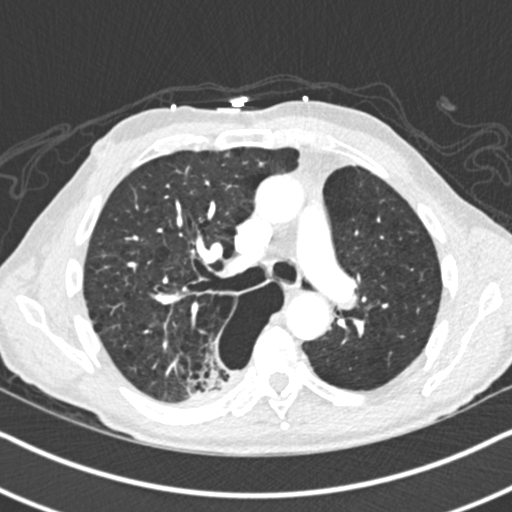
[im 178/292  soft-tissue]
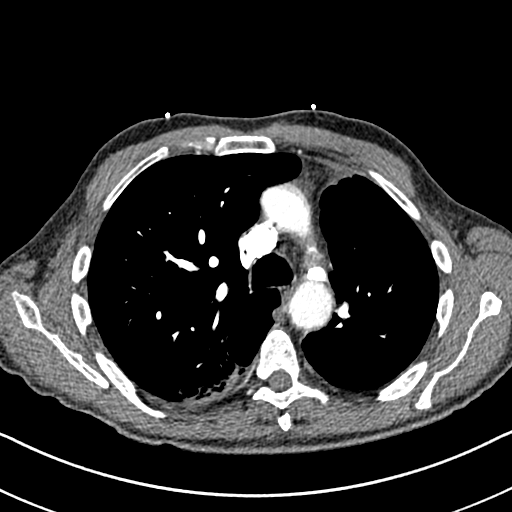
[im 203/292  lung]
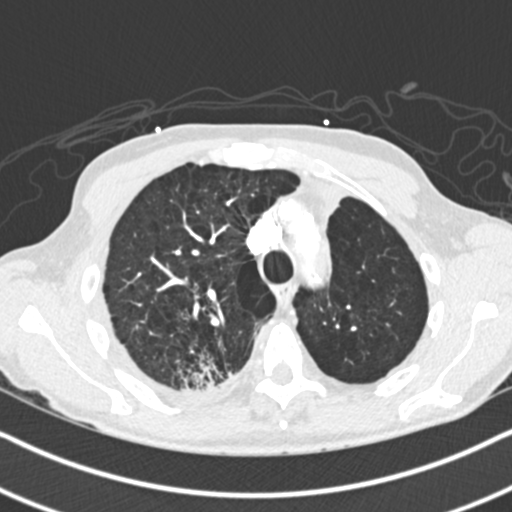
[im 216/292  soft-tissue]
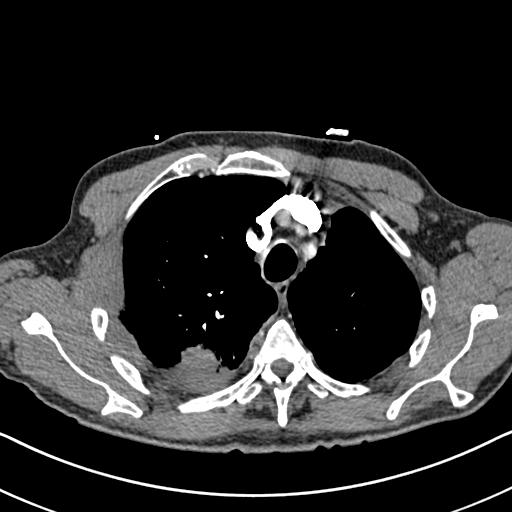
[im 241/292  lung]
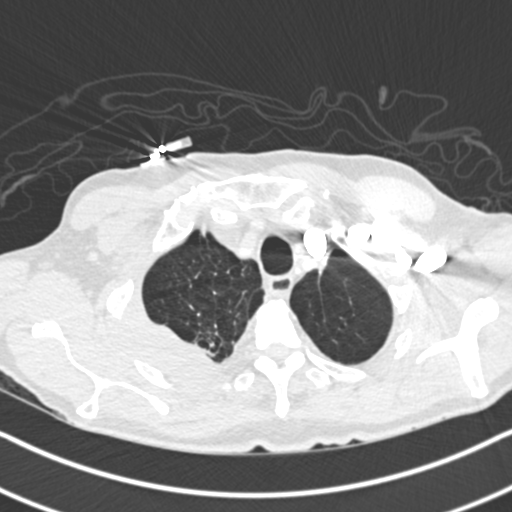
[im 254/292  soft-tissue]
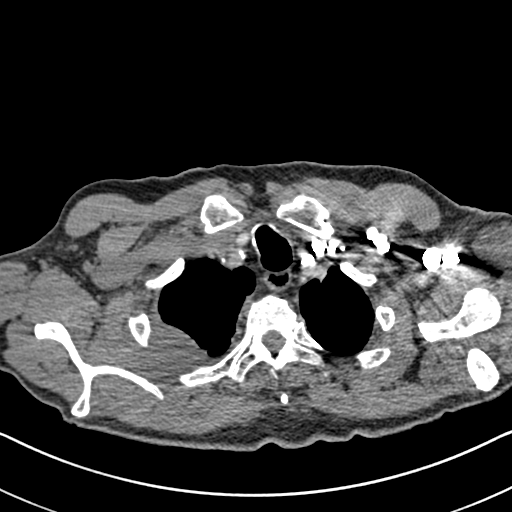
[im 279/292  lung]
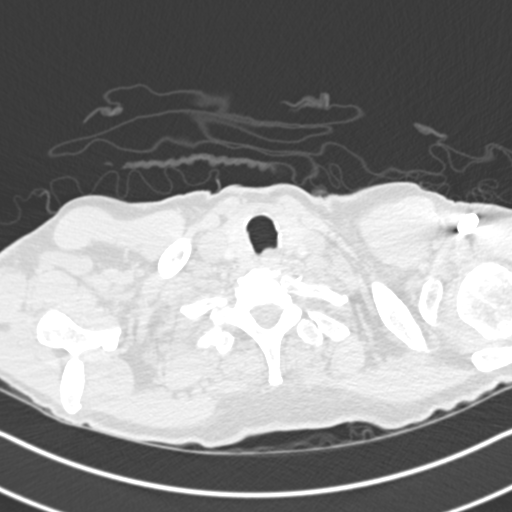

[Series 7: coronal mpr · coronal · 0.64mm/px · 2 of 89 slices shown]
[im 30/89  soft-tissue]
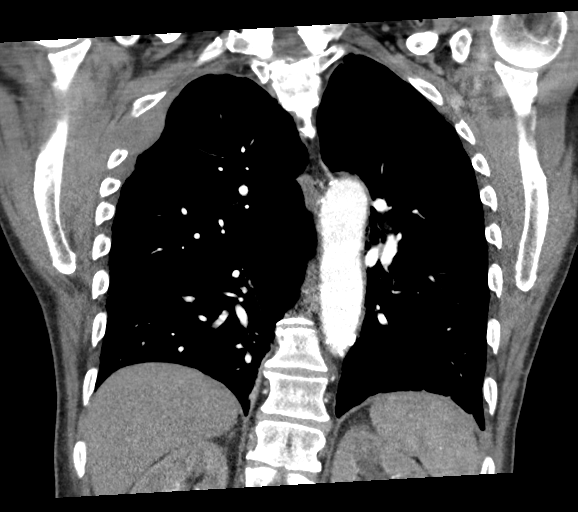
[im 59/89  soft-tissue]
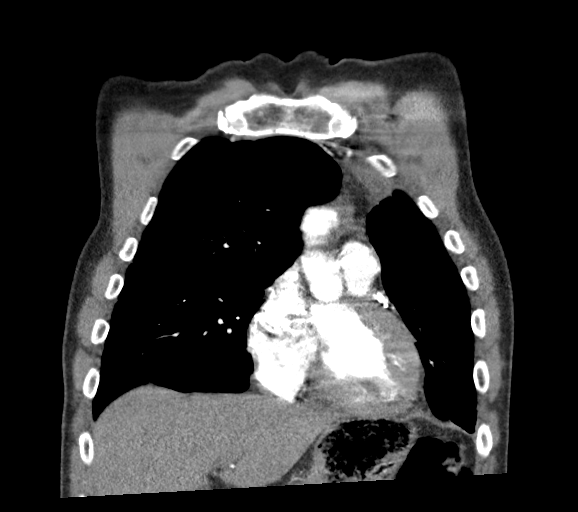

[17 of 46 positions shown; findings below may reference images not displayed]

FINDINGS: Cardiovascular: No acute pulmonary embolus. Aortic atherosclerosis
with coronary arteriosclerosis along the LAD and proximal RCA. No
pericardial effusion. Heart size is normal.

Mediastinum/Nodes: No evidence of pneumomediastinum. The trachea and
mainstem bronchi are patent. The esophagus is nonacute. No
thyromegaly or mass. No mediastinal or hilar lymphadenopathy.

Lungs/Pleura: Interval development of pleural thickening and/or
loculated fluid along the periphery of the right upper lobe. There
is a partially obscured 3 cm mass posteriorly in the right upper
lobe merging with this area of pleural thickening or fluid. Findings
are concerning for neoplasm, series 6, image 24. Right
paramediastinal pulmonary blebs and bullae are noted, similar in
appearance to prior. Masslike opacity seen in the left upper lobe
abutting the major fissure is no longer visualized. Calcified
pleural plaque are noted along the posterior aspect of the right
hemithorax. There is mild bronchiectatic change in the right upper
lobe adjacent to the mass and posterior segment of right upper lobe.

Upper Abdomen: No acute abnormality. No adrenal mass. Partially
included left upper pole renal cyst measuring on the order of 13 and
18 mm.

Musculoskeletal: Patchy demineralization of the ribs adjacent to the
area of pleural thickening in the right upper lobe.

Review of the MIP images confirms the above findings.
IMPRESSION: 1. Interval development of a 3 cm masslike opacity, partially
obscured by presumed pleural thickening overlying the posterior
aspect of the right upper lobe. Findings are concerning for
neoplasm, less likely rounded atelectasis associated with pleural
disease. Consider one of the following in 3 months for both low-risk
and high-risk individuals: (a) repeat chest CT, (b) follow-up
PET-CT, or (c) tissue sampling. This recommendation follows the
consensus statement: Guidelines for Management of Incidental
Pulmonary Nodules Detected on CT Images: From the [HOSPITAL]
adjacent ribs deep to the rib pleural thickening and mass without
frank bone destruction or fracture.
2. Pulmonary bulla and blebs along the mediastinum more so on the
right accounting for hyperlucency on same chest radiographs. No
pneumomediastinum.
3. Masslike opacity along the left major fissure is no longer
apparent.
4. No acute pulmonary embolus.
5. Aortic atherosclerosis without aneurysm or dissection. Coronary
arteriosclerosis.
6. Left upper pole renal cysts.

Aortic Atherosclerosis (6IHRD-ISS.S) and Emphysema (6IHRD-536.B).
